# Patient Record
Sex: Male | Born: 1950 | Marital: Married | Smoking: Current some day smoker
Health system: Southern US, Community
[De-identification: ages and names within clinical notes are randomized; demographics above are authoritative.]

## PROBLEM LIST (undated history)

## (undated) DIAGNOSIS — E785 Hyperlipidemia, unspecified: Secondary | ICD-10-CM

## (undated) DIAGNOSIS — D490 Neoplasm of unspecified behavior of digestive system: Secondary | ICD-10-CM

## (undated) DIAGNOSIS — I10 Essential (primary) hypertension: Secondary | ICD-10-CM

## (undated) DIAGNOSIS — E119 Type 2 diabetes mellitus without complications: Secondary | ICD-10-CM

## (undated) DIAGNOSIS — I6529 Occlusion and stenosis of unspecified carotid artery: Secondary | ICD-10-CM

## (undated) DIAGNOSIS — N189 Chronic kidney disease, unspecified: Secondary | ICD-10-CM

## (undated) DIAGNOSIS — N529 Male erectile dysfunction, unspecified: Secondary | ICD-10-CM

## (undated) DIAGNOSIS — Z720 Tobacco use: Secondary | ICD-10-CM

## (undated) HISTORY — DX: Tobacco use: Z72.0

## (undated) HISTORY — DX: Hyperlipidemia, unspecified: E78.5

## (undated) HISTORY — PX: TONSILLECTOMY: SUR1361

## (undated) HISTORY — DX: Type 2 diabetes mellitus without complications: E11.9

## (undated) HISTORY — DX: Chronic kidney disease, unspecified: N18.9

## (undated) HISTORY — DX: Male erectile dysfunction, unspecified: N52.9

---
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
## 1999-09-24 ENCOUNTER — Emergency Department (HOSPITAL_COMMUNITY): Admission: EM | Admit: 1999-09-24 | Discharge: 1999-09-24 | Payer: Self-pay | Admitting: Emergency Medicine

## 2001-06-21 ENCOUNTER — Encounter: Admission: RE | Admit: 2001-06-21 | Discharge: 2001-09-19 | Payer: Self-pay | Admitting: Family Medicine

## 2004-01-13 HISTORY — PX: APPENDECTOMY: SHX54

## 2004-01-18 ENCOUNTER — Emergency Department (HOSPITAL_COMMUNITY): Admission: EM | Admit: 2004-01-18 | Discharge: 2004-01-19 | Payer: Self-pay | Admitting: Emergency Medicine

## 2004-03-01 ENCOUNTER — Emergency Department (HOSPITAL_COMMUNITY): Admission: EM | Admit: 2004-03-01 | Discharge: 2004-03-01 | Payer: Self-pay | Admitting: *Deleted

## 2004-03-02 ENCOUNTER — Observation Stay (HOSPITAL_COMMUNITY): Admission: EM | Admit: 2004-03-02 | Discharge: 2004-03-02 | Payer: Self-pay | Admitting: Emergency Medicine

## 2011-10-08 ENCOUNTER — Other Ambulatory Visit: Payer: Self-pay | Admitting: Gastroenterology

## 2013-08-12 ENCOUNTER — Encounter: Payer: Self-pay | Admitting: *Deleted

## 2015-03-21 ENCOUNTER — Other Ambulatory Visit: Payer: Self-pay | Admitting: Family Medicine

## 2015-03-21 DIAGNOSIS — E049 Nontoxic goiter, unspecified: Secondary | ICD-10-CM

## 2015-03-26 ENCOUNTER — Ambulatory Visit
Admission: RE | Admit: 2015-03-26 | Discharge: 2015-03-26 | Disposition: A | Discharge status: Home / Self Care | Payer: BLUE CROSS/BLUE SHIELD | Source: Ambulatory Visit | Attending: Family Medicine | Admitting: Family Medicine

## 2015-03-26 DIAGNOSIS — E049 Nontoxic goiter, unspecified: Secondary | ICD-10-CM

## 2017-02-15 DIAGNOSIS — I10 Essential (primary) hypertension: Secondary | ICD-10-CM | POA: Diagnosis not present

## 2017-02-15 DIAGNOSIS — Z0000 Encounter for general adult medical examination without abnormal findings: Secondary | ICD-10-CM | POA: Diagnosis not present

## 2017-02-15 DIAGNOSIS — E782 Mixed hyperlipidemia: Secondary | ICD-10-CM | POA: Diagnosis not present

## 2017-02-15 DIAGNOSIS — E1122 Type 2 diabetes mellitus with diabetic chronic kidney disease: Secondary | ICD-10-CM | POA: Diagnosis not present

## 2017-02-15 DIAGNOSIS — Z1159 Encounter for screening for other viral diseases: Secondary | ICD-10-CM | POA: Diagnosis not present

## 2017-02-15 DIAGNOSIS — Z23 Encounter for immunization: Secondary | ICD-10-CM | POA: Diagnosis not present

## 2017-02-15 DIAGNOSIS — G629 Polyneuropathy, unspecified: Secondary | ICD-10-CM | POA: Diagnosis not present

## 2017-02-15 DIAGNOSIS — E559 Vitamin D deficiency, unspecified: Secondary | ICD-10-CM | POA: Diagnosis not present

## 2017-02-15 DIAGNOSIS — L570 Actinic keratosis: Secondary | ICD-10-CM | POA: Diagnosis not present

## 2017-02-15 DIAGNOSIS — N183 Chronic kidney disease, stage 3 (moderate): Secondary | ICD-10-CM | POA: Diagnosis not present

## 2017-05-10 IMAGING — US US SOFT TISSUE HEAD/NECK
1 series · 13 of 25 positions shown · non-contrast
Comparison: None.

CLINICAL DATA: Enlarged thyroid gland on physical examination.

EXAM:
THYROID ULTRASOUND
TECHNIQUE: Ultrasound examination of the thyroid gland and adjacent soft
tissues was performed.

[Series 1: us soft tissue head/neck · 0.06mm/px · 13 of 39 slices shown]
[im 1/39]
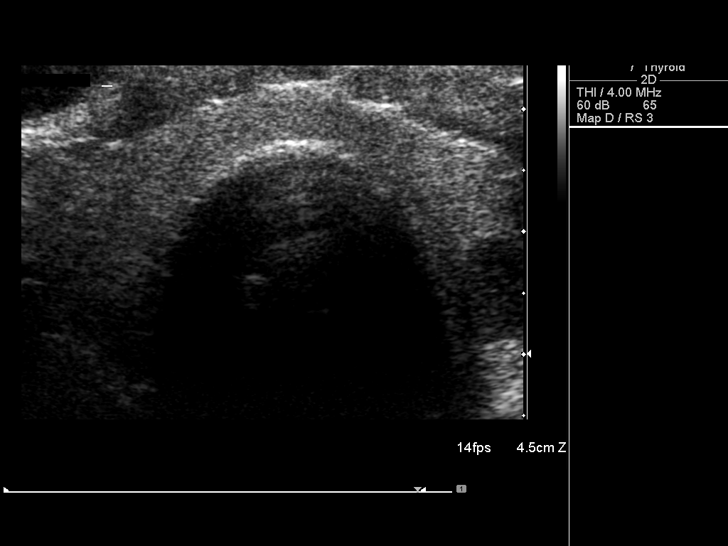
[im 4/39]
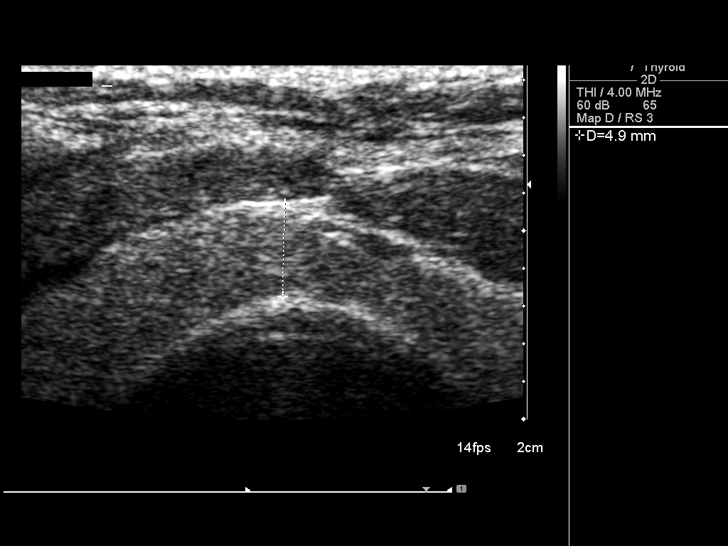
[im 7/39]
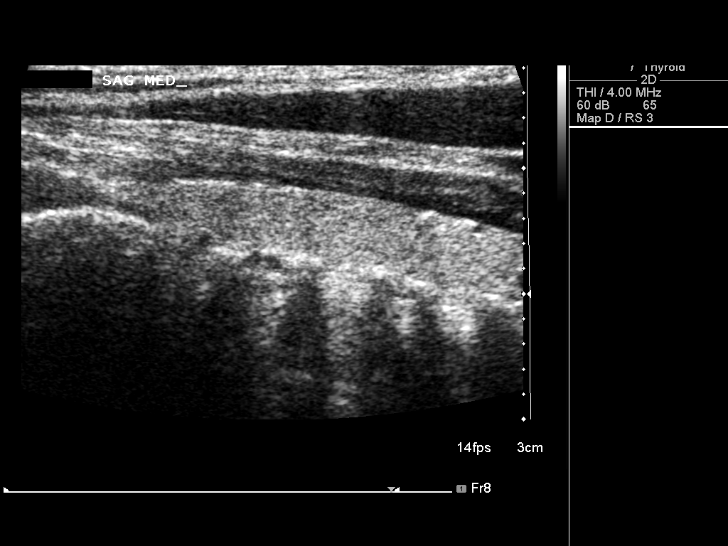
[im 10/39]
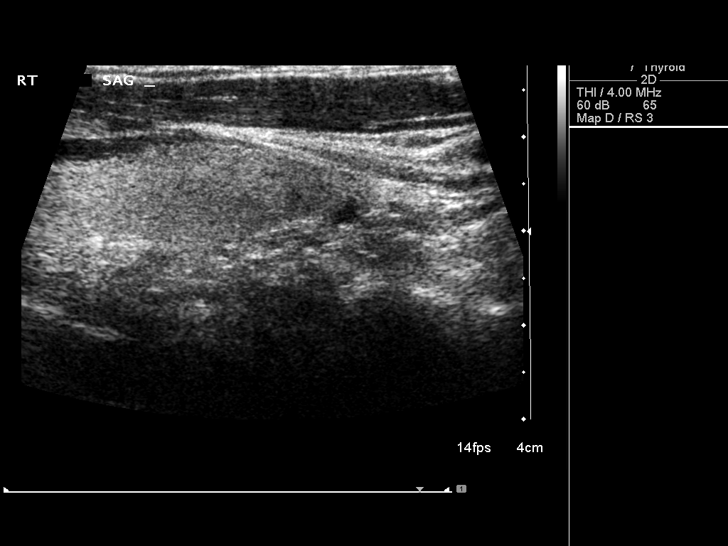
[im 13/39]
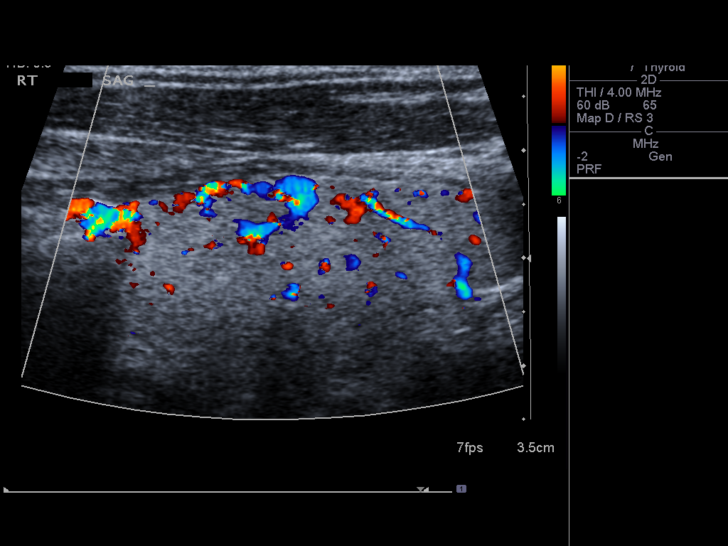
[im 16/39]
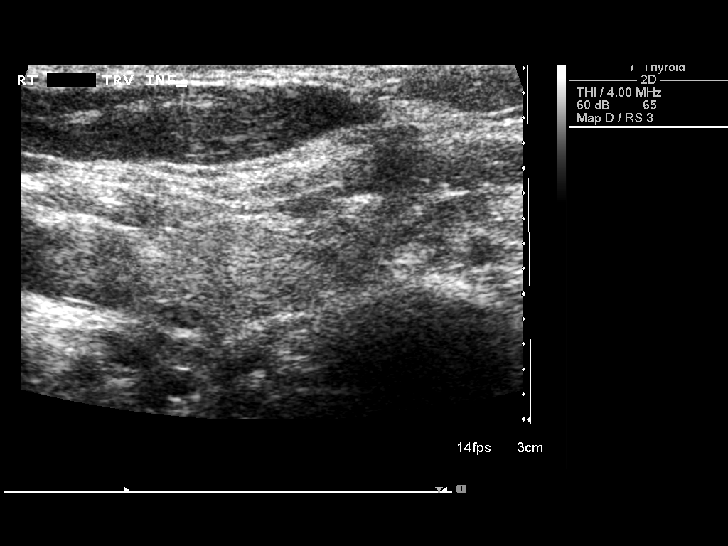
[im 20/39]
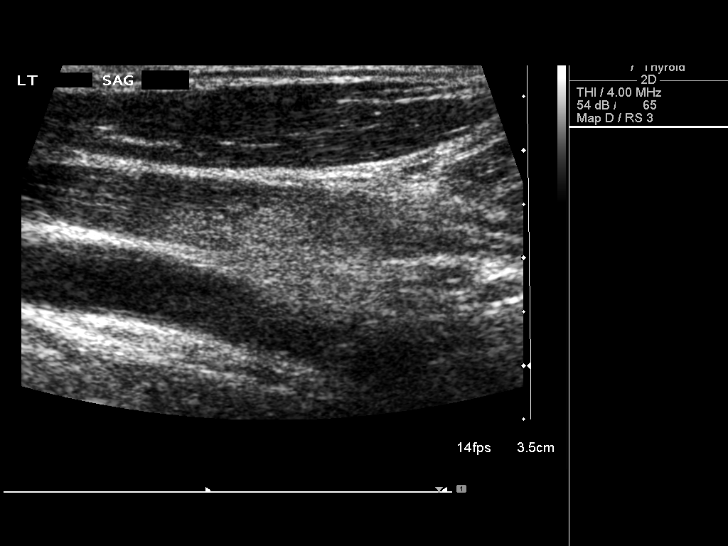
[im 23/39]
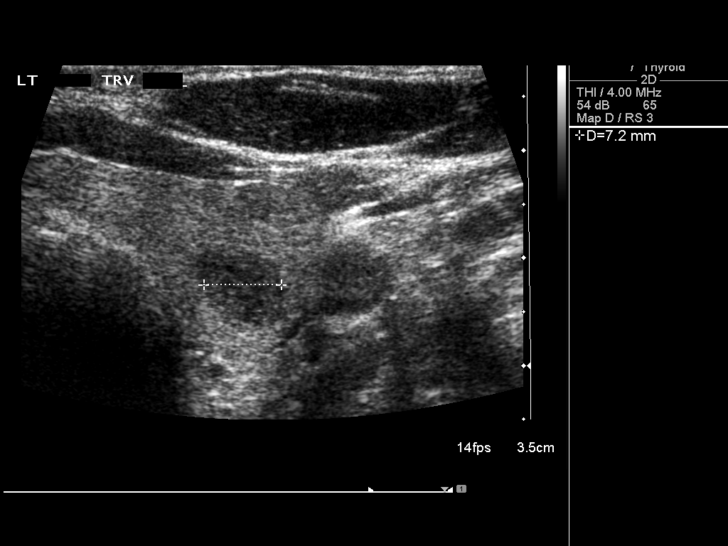
[im 26/39]
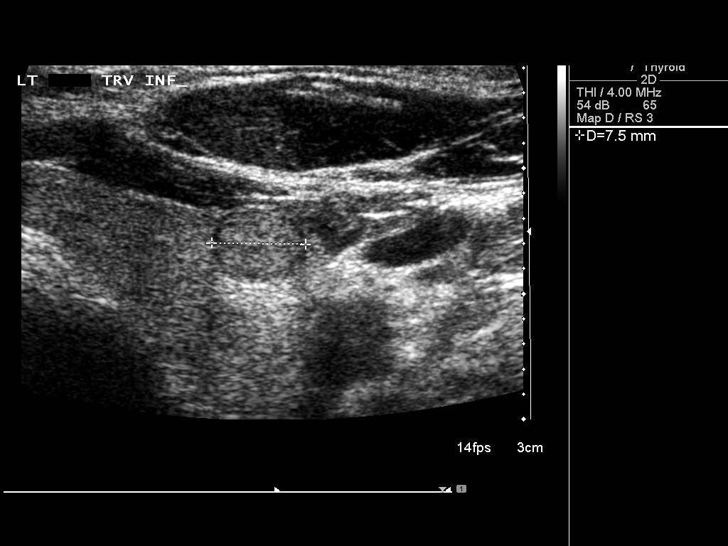
[im 29/39]
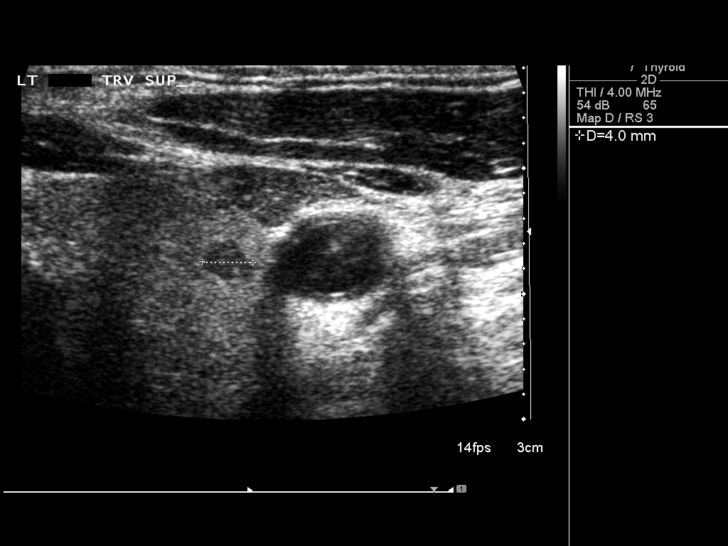
[im 32/39]
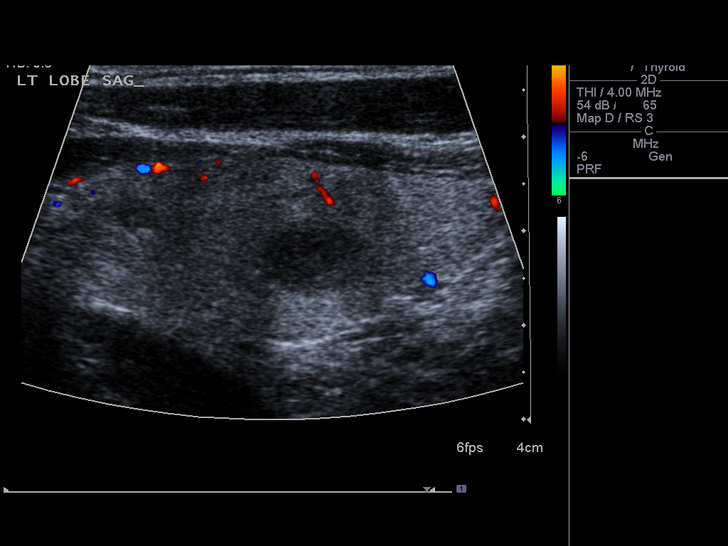
[im 35/39]
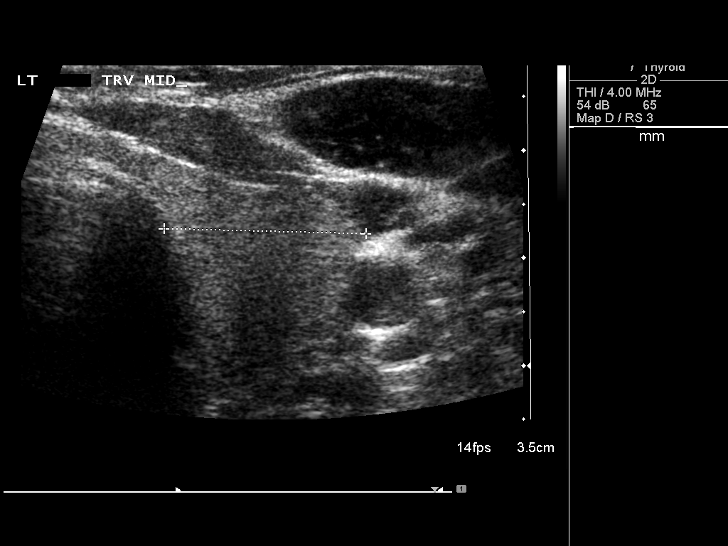
[im 39/39]
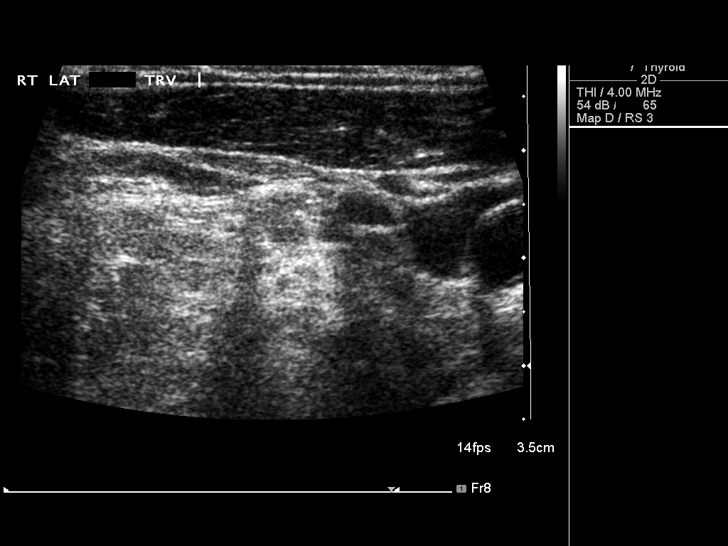

[13 of 25 positions shown; findings below may reference images not displayed]

FINDINGS: Right thyroid lobe

Measurements: 4.4 x 2.4 x 2.1 cm.  No nodules visualized.

Left thyroid lobe

Measurements: 4.5 x 2.1 x 1.9 cm.

There are 3 distinct hypoechoic solid nodules in the left lobe. The
largest in the inferior left lobe measures approximately 1.1 x 0.8 x
0.7 cm. The second largest is immediately adjacent and measures
approximately 0.8 x 0.6 x 0.8 cm. Third smaller superior nodule
measures approximately 0.6 x 0.4 x 0.4 cm. All of these nodules are
noncalcified.

Isthmus

Thickness: 0.5 cm.  No nodules visualized.

Lymphadenopathy

None visualized.
IMPRESSION: Overall top-normal size of thyroid gland. Three small distinct
nodules are seen in the left lobe. These do not meet criteria for
biopsy and can be followed.

Findings do not meet current SRU consensus criteria for biopsy.
Follow-up by clinical exam is recommended. If patient has known risk
factors for thyroid carcinoma, consider follow-up ultrasound in 12
months. If patient is clinically hyperthyroid, consider nuclear
medicine thyroid uptake and scan.Reference: Management of Thyroid
Nodules Detected at US: Society of Radiologists in Ultrasound

## 2017-05-13 DIAGNOSIS — L821 Other seborrheic keratosis: Secondary | ICD-10-CM | POA: Diagnosis not present

## 2017-05-13 DIAGNOSIS — L570 Actinic keratosis: Secondary | ICD-10-CM | POA: Diagnosis not present

## 2017-05-13 DIAGNOSIS — L814 Other melanin hyperpigmentation: Secondary | ICD-10-CM | POA: Diagnosis not present

## 2017-05-13 DIAGNOSIS — D225 Melanocytic nevi of trunk: Secondary | ICD-10-CM | POA: Diagnosis not present

## 2017-07-21 DIAGNOSIS — N183 Chronic kidney disease, stage 3 (moderate): Secondary | ICD-10-CM | POA: Diagnosis not present

## 2017-07-21 DIAGNOSIS — E1122 Type 2 diabetes mellitus with diabetic chronic kidney disease: Secondary | ICD-10-CM | POA: Diagnosis not present

## 2017-07-21 DIAGNOSIS — Z7984 Long term (current) use of oral hypoglycemic drugs: Secondary | ICD-10-CM | POA: Diagnosis not present

## 2017-07-21 DIAGNOSIS — E782 Mixed hyperlipidemia: Secondary | ICD-10-CM | POA: Diagnosis not present

## 2017-07-21 DIAGNOSIS — I10 Essential (primary) hypertension: Secondary | ICD-10-CM | POA: Diagnosis not present

## 2017-07-21 DIAGNOSIS — G629 Polyneuropathy, unspecified: Secondary | ICD-10-CM | POA: Diagnosis not present

## 2017-12-07 DIAGNOSIS — L578 Other skin changes due to chronic exposure to nonionizing radiation: Secondary | ICD-10-CM | POA: Diagnosis not present

## 2017-12-07 DIAGNOSIS — L570 Actinic keratosis: Secondary | ICD-10-CM | POA: Diagnosis not present

## 2017-12-07 DIAGNOSIS — C4442 Squamous cell carcinoma of skin of scalp and neck: Secondary | ICD-10-CM | POA: Diagnosis not present

## 2017-12-07 DIAGNOSIS — D225 Melanocytic nevi of trunk: Secondary | ICD-10-CM | POA: Diagnosis not present

## 2017-12-07 DIAGNOSIS — D485 Neoplasm of uncertain behavior of skin: Secondary | ICD-10-CM | POA: Diagnosis not present

## 2017-12-27 DIAGNOSIS — R69 Illness, unspecified: Secondary | ICD-10-CM | POA: Diagnosis not present

## 2018-02-01 DIAGNOSIS — C4442 Squamous cell carcinoma of skin of scalp and neck: Secondary | ICD-10-CM | POA: Diagnosis not present

## 2018-02-28 ENCOUNTER — Other Ambulatory Visit: Payer: Self-pay

## 2018-02-28 DIAGNOSIS — I10 Essential (primary) hypertension: Secondary | ICD-10-CM | POA: Diagnosis not present

## 2018-02-28 DIAGNOSIS — E782 Mixed hyperlipidemia: Secondary | ICD-10-CM | POA: Diagnosis not present

## 2018-02-28 DIAGNOSIS — Z1211 Encounter for screening for malignant neoplasm of colon: Secondary | ICD-10-CM | POA: Diagnosis not present

## 2018-02-28 DIAGNOSIS — E1122 Type 2 diabetes mellitus with diabetic chronic kidney disease: Secondary | ICD-10-CM | POA: Diagnosis not present

## 2018-02-28 NOTE — Patient Outreach (Signed)
  [facility]) Care Management Chronic Special Needs Program   02/28/2018  Name: Brookes Craine, DOB: 1950/09/15  MRN: [id number]  The client was discussed in today's interdisciplinary care team meeting. The client's individualized care plan was developed based on completed Health Risk Assessment   The following issues were discussed:  Client's needs, Key risk triggers/risk stratification and Care Plan  Participants present:    Mahlon Gammon, MSN, RN, CCM, CNS    Thea Silversmith, MSN, RN, CCM   Melissa Sandlin RN,BSN,CCM, CDE       Recommendations:  Send Quit smoking information, diabetes and exercise information  Plan:  Plan to send copy of individualized care plan to client Plan to send individualized care plan to provider. Chronic Care Management Coordinator will outreach in 2-4 months  Follow-up:  2-4 months  Peter Garter RN, Jackquline Denmark, CDE Chronic Care Management Coordinator [place] [facility] [phone]

## 2018-03-16 DIAGNOSIS — Z7984 Long term (current) use of oral hypoglycemic drugs: Secondary | ICD-10-CM | POA: Diagnosis not present

## 2018-03-16 DIAGNOSIS — E119 Type 2 diabetes mellitus without complications: Secondary | ICD-10-CM | POA: Diagnosis not present

## 2018-03-16 DIAGNOSIS — H2513 Age-related nuclear cataract, bilateral: Secondary | ICD-10-CM | POA: Diagnosis not present

## 2018-05-19 DIAGNOSIS — L570 Actinic keratosis: Secondary | ICD-10-CM | POA: Diagnosis not present

## 2018-05-19 DIAGNOSIS — R238 Other skin changes: Secondary | ICD-10-CM | POA: Diagnosis not present

## 2018-05-19 DIAGNOSIS — D485 Neoplasm of uncertain behavior of skin: Secondary | ICD-10-CM | POA: Diagnosis not present

## 2018-05-19 DIAGNOSIS — C44321 Squamous cell carcinoma of skin of nose: Secondary | ICD-10-CM | POA: Diagnosis not present

## 2018-06-21 ENCOUNTER — Other Ambulatory Visit: Payer: Self-pay

## 2018-06-21 NOTE — Patient Outreach (Signed)
[facility]) Care Management Chronic Special Needs Program  06/21/2018  Name: Joseph Sweeney DOB: [birth year]  MRN: [id number]  Mr. Joseph Sweeney is enrolled in a chronic special needs plan for Diabetes. Chronic Care Management Coordinator telephoned client to review health risk assessment and to develop individualized care plan.  Introduced the chronic care management program, importance of client participation, and taking their care plan to all provider appointments and inpatient facilities.  Reviewed the transition of care process and possible referral to community care management.  Subjective: Client states that he has been working on getting his Hemoglobin A1C lower after his reading was up with his last check.  States that his provider increased his Metformin.  States he checks his blood sugar every other day and they range 82-115 in the morning and can be up to 200 after meals.  States he was having some low readings but has not had one this month.  States he does yard work for exercise and he was swimming at [employer]  When it was open.  States he is still smoking but he has cut back some.  Goals Addressed            This Visit's Progress   . Client understands the importance of follow-up with providers by attending scheduled visits   On track   . Client will use Assistive Devices as needed and verbalize understanding of device use   On track    [employer] approved meters-One Touch, Freestyle or Precision    . Decrease inpatient admissions/ readmissions with in the next year   On track   . Decrease inpatient diabetes admissions/readmissions with in the year   On track   . Decrease the use of hospital emergency department related to diabetes within the next year    On track   . Diabetes Patient stated goal to get more exercise (pt-stated)   On track   . HEMOGLOBIN A1C < 7.0       Diabetes self management actions:  Glucose monitoring per provider recommendations   Eat Healthy  Check feet daily  Visit provider every 3-6 months as directed  Hbg A1C level every 3-6 months.  Eye Exam yearly    . Maintain timely refills of diabetic medication as prescribed within the year .   On track   . COMPLETED: Obtain annual  Lipid Profile, LDL-C       Completed 02/28/2018     . COMPLETED: Obtain Annual Eye (retinal)  Exam    On track    Completed 03/16/2018    . COMPLETED: Obtain Annual Foot Exam       Completed 02/28/2018    . Obtain annual screen for micro albuminuria (urine) , nephropathy (kidney problems)   On track   . Obtain Hemoglobin A1C at least 2 times per year   On track   . Quit Smoking (pt-stated)   Not on track   . Visit Primary Care Provider or Endocrinologist at least 2 times per year    On track    Client is not meeting diabetes self management goal of hemoglobin A1C of <7% with last reading of 7.7% Reinforced importance of smoking cessation Reviewed s/s of hypoglycemia and the rule of 15 to treat hypoglycemia.   Encouraged to continue to be active and to return to swimming when possible  Plan:  Send successful outreach letter with a copy of their individualized care plan, Send individual care plan to provider and Send  educational material-hypoglycemia, smoking cessation  Chronic care management coordination will outreach in:  5-6 Months     Peter Garter RN, [facility], [place] Management Coordinator [place] Management [phone]

## 2018-07-07 DIAGNOSIS — Z86010 Personal history of colonic polyps: Secondary | ICD-10-CM | POA: Diagnosis not present

## 2018-07-07 DIAGNOSIS — D120 Benign neoplasm of cecum: Secondary | ICD-10-CM | POA: Diagnosis not present

## 2018-07-13 DIAGNOSIS — D120 Benign neoplasm of cecum: Secondary | ICD-10-CM | POA: Diagnosis not present

## 2018-07-14 DIAGNOSIS — C44321 Squamous cell carcinoma of skin of nose: Secondary | ICD-10-CM | POA: Diagnosis not present

## 2018-07-14 DIAGNOSIS — L570 Actinic keratosis: Secondary | ICD-10-CM | POA: Diagnosis not present

## 2018-07-26 DIAGNOSIS — Z0000 Encounter for general adult medical examination without abnormal findings: Secondary | ICD-10-CM | POA: Diagnosis not present

## 2018-07-26 DIAGNOSIS — Z125 Encounter for screening for malignant neoplasm of prostate: Secondary | ICD-10-CM | POA: Diagnosis not present

## 2018-07-26 DIAGNOSIS — E782 Mixed hyperlipidemia: Secondary | ICD-10-CM | POA: Diagnosis not present

## 2018-07-26 DIAGNOSIS — E559 Vitamin D deficiency, unspecified: Secondary | ICD-10-CM | POA: Diagnosis not present

## 2018-07-26 DIAGNOSIS — I10 Essential (primary) hypertension: Secondary | ICD-10-CM | POA: Diagnosis not present

## 2018-07-26 DIAGNOSIS — E1122 Type 2 diabetes mellitus with diabetic chronic kidney disease: Secondary | ICD-10-CM | POA: Diagnosis not present

## 2018-08-04 DIAGNOSIS — I10 Essential (primary) hypertension: Secondary | ICD-10-CM | POA: Diagnosis not present

## 2018-08-04 DIAGNOSIS — Z125 Encounter for screening for malignant neoplasm of prostate: Secondary | ICD-10-CM | POA: Diagnosis not present

## 2018-08-04 DIAGNOSIS — E559 Vitamin D deficiency, unspecified: Secondary | ICD-10-CM | POA: Diagnosis not present

## 2018-08-04 DIAGNOSIS — E1122 Type 2 diabetes mellitus with diabetic chronic kidney disease: Secondary | ICD-10-CM | POA: Diagnosis not present

## 2018-08-04 DIAGNOSIS — E782 Mixed hyperlipidemia: Secondary | ICD-10-CM | POA: Diagnosis not present

## 2018-08-04 DIAGNOSIS — Z0000 Encounter for general adult medical examination without abnormal findings: Secondary | ICD-10-CM | POA: Diagnosis not present

## 2018-09-01 DIAGNOSIS — C44321 Squamous cell carcinoma of skin of nose: Secondary | ICD-10-CM | POA: Diagnosis not present

## 2018-09-26 DIAGNOSIS — S0501XA Injury of conjunctiva and corneal abrasion without foreign body, right eye, initial encounter: Secondary | ICD-10-CM | POA: Diagnosis not present

## 2018-09-26 DIAGNOSIS — H5711 Ocular pain, right eye: Secondary | ICD-10-CM | POA: Diagnosis not present

## 2018-09-28 DIAGNOSIS — S0501XD Injury of conjunctiva and corneal abrasion without foreign body, right eye, subsequent encounter: Secondary | ICD-10-CM | POA: Diagnosis not present

## 2018-10-24 DIAGNOSIS — I10 Essential (primary) hypertension: Secondary | ICD-10-CM | POA: Diagnosis not present

## 2018-10-24 DIAGNOSIS — E1122 Type 2 diabetes mellitus with diabetic chronic kidney disease: Secondary | ICD-10-CM | POA: Diagnosis not present

## 2018-10-24 DIAGNOSIS — N1831 Chronic kidney disease, stage 3a: Secondary | ICD-10-CM | POA: Diagnosis not present

## 2018-10-24 DIAGNOSIS — Z23 Encounter for immunization: Secondary | ICD-10-CM | POA: Diagnosis not present

## 2018-10-24 DIAGNOSIS — N521 Erectile dysfunction due to diseases classified elsewhere: Secondary | ICD-10-CM | POA: Diagnosis not present

## 2018-11-17 DIAGNOSIS — L570 Actinic keratosis: Secondary | ICD-10-CM | POA: Diagnosis not present

## 2018-11-17 DIAGNOSIS — D225 Melanocytic nevi of trunk: Secondary | ICD-10-CM | POA: Diagnosis not present

## 2018-11-17 DIAGNOSIS — Z85828 Personal history of other malignant neoplasm of skin: Secondary | ICD-10-CM | POA: Diagnosis not present

## 2018-12-13 ENCOUNTER — Other Ambulatory Visit: Payer: Self-pay

## 2018-12-13 DIAGNOSIS — I10 Essential (primary) hypertension: Secondary | ICD-10-CM

## 2018-12-13 DIAGNOSIS — E785 Hyperlipidemia, unspecified: Secondary | ICD-10-CM | POA: Insufficient documentation

## 2018-12-13 DIAGNOSIS — N183 Chronic kidney disease, stage 3 unspecified: Secondary | ICD-10-CM

## 2018-12-13 DIAGNOSIS — E782 Mixed hyperlipidemia: Secondary | ICD-10-CM

## 2018-12-13 DIAGNOSIS — E1122 Type 2 diabetes mellitus with diabetic chronic kidney disease: Secondary | ICD-10-CM | POA: Insufficient documentation

## 2018-12-13 NOTE — Patient Outreach (Signed)
[facility]) Care Management Chronic Special Needs Program  12/13/2018  Name: Joseph Sweeney DOB: 05/14/1950  MRN: [id number]  Mr. Joseph Sweeney is enrolled in a chronic special needs plan for Diabetes. Reviewed and updated care plan.  Subjective: States he ash been doing good.  States he is active working in his yard and he does not feel safe to go to [employer] yet.  States he sometimes has low blood sugar readings when he is out working in the yard.  States he sometimes does not eat lunch or eats a snack.  States he is still smoking but he has cut back to 1/2 pack a day.  States he tries to watch his diet but he sometimes has larger portions at dinner especially with pasta.  Denies any problems with his medications  Goals Addressed            This Visit's Progress   . Client understands the importance of follow-up with providers by attending scheduled visits   On track    Keep appts as scheduled    . COMPLETED: Client will use Assistive Devices as needed and verbalize understanding of device use   On track    No difficulty with glucometer    . COMPLETED: Decrease inpatient admissions/ readmissions with in the next year   On track    No admissions    . COMPLETED: Decrease inpatient diabetes admissions/readmissions with in the year   On track    No admissions    . COMPLETED: Decrease the use of hospital emergency department related to diabetes within the next year        No ED visits    . Diabetes Patient stated goal to get more exercise (pt-stated)   On track    Continue to be active around home and start going to [facility] when safe    . HEMOGLOBIN A1C < 7.0        Diabetes self management actions:  Glucose monitoring per provider recommendations  Eat Healthy  Check feet daily  Visit provider every 3-6 months as directed  Hbg A1C level every 3-6 months.  Eye Exam yearly    . Maintain timely refills of diabetic medication as prescribed within the year .   On  track    No difficulties with medications    . COMPLETED: Obtain annual screen for micro albuminuria (urine) , nephropathy (kidney problems)       Completed 10/24/18    . COMPLETED: Obtain Hemoglobin A1C at least 2 times per year       Completed 02/28/18, 08/04/18    . Quit Smoking (pt-stated)   Not on track    [employer] [phone]    . COMPLETED: Visit Primary Care Provider or Endocrinologist at least 2 times per year        Completed 02/28/18, 08/04/18, 10/24/18      Client is meeting diabetes self management goal of hemoglobin A1C of <7% with last reading of 6.9% Reinforced to follow a low CHO low sodium diet and reviewed portion sizes of pasta Encouraged to continue to be active working in his yard and to go back to [employer] when it is safe to do so Discussed smoking cessation and the quit smart program Reviewed s/s of hypoglycemia and actions to take.  Encouraged to eat a snack when working in the yard Reviewed number for 24-hour [place] 19 precautions  Plan:  Send successful outreach letter with a copy of  their individualized care plan and Send individual care plan to provider  Chronic care management coordinator will outreach in:  6-8 Months per tier level    [place], [facility], [place] Management [phone]

## 2019-02-20 DIAGNOSIS — I10 Essential (primary) hypertension: Secondary | ICD-10-CM | POA: Diagnosis not present

## 2019-02-20 DIAGNOSIS — N1831 Chronic kidney disease, stage 3a: Secondary | ICD-10-CM | POA: Diagnosis not present

## 2019-02-20 DIAGNOSIS — F17200 Nicotine dependence, unspecified, uncomplicated: Secondary | ICD-10-CM | POA: Diagnosis not present

## 2019-02-20 DIAGNOSIS — E782 Mixed hyperlipidemia: Secondary | ICD-10-CM | POA: Diagnosis not present

## 2019-02-20 DIAGNOSIS — E1122 Type 2 diabetes mellitus with diabetic chronic kidney disease: Secondary | ICD-10-CM | POA: Diagnosis not present

## 2019-03-05 ENCOUNTER — Ambulatory Visit: Payer: HMO

## 2019-03-09 ENCOUNTER — Ambulatory Visit: Payer: HMO | Attending: Internal Medicine

## 2019-03-09 DIAGNOSIS — Z23 Encounter for immunization: Secondary | ICD-10-CM | POA: Insufficient documentation

## 2019-03-09 NOTE — Progress Notes (Signed)
   Covid-19 Vaccination Clinic  Name:  Joseph Sweeney    MRN: [id number] DOB: Dec 16, 1950  03/09/2019  Joseph Sweeney was observed post Covid-19 immunization for 15 minutes without incidence. He was provided with Vaccine Information Sheet and instruction to access the V-Safe system.   Joseph Sweeney was instructed to call 911 with any severe reactions post vaccine: Marland Kitchen Difficulty breathing  . Swelling of your face and throat  . A fast heartbeat  . A bad rash all over your body  . Dizziness and weakness    Immunizations Administered    Name Date Dose VIS Date Route   Pfizer COVID-19 Vaccine 03/09/2019  9:37 AM 0.3 mL 12/23/2018 Intramuscular   Manufacturer: [place]   Lot: [id number]   [place]: [id number]

## 2019-03-29 ENCOUNTER — Ambulatory Visit: Payer: HMO

## 2019-04-18 ENCOUNTER — Ambulatory Visit: Payer: HMO | Attending: Internal Medicine

## 2019-04-18 DIAGNOSIS — Z23 Encounter for immunization: Secondary | ICD-10-CM

## 2019-04-18 NOTE — Progress Notes (Signed)
   Covid-19 Vaccination Clinic  Name:  Joseph Sweeney    MRN: [id number] DOB: 1950/08/17  04/18/2019  Mr. Hinger was observed post Covid-19 immunization for 15 minutes without incident. He was provided with Vaccine Information Sheet and instruction to access the V-Safe system.   Mr. Gerhold was instructed to call 911 with any severe reactions post vaccine: Marland Kitchen Difficulty breathing  . Swelling of face and throat  . A fast heartbeat  . A bad rash all over body  . Dizziness and weakness   Immunizations Administered    Name Date Dose VIS Date Route   Pfizer COVID-19 Vaccine 04/18/2019 11:58 AM 0.3 mL 12/23/2018 Intramuscular   Manufacturer: [place]   Lot: [id number]   [place]: [id number]

## 2019-05-22 DIAGNOSIS — L821 Other seborrheic keratosis: Secondary | ICD-10-CM | POA: Diagnosis not present

## 2019-05-22 DIAGNOSIS — L570 Actinic keratosis: Secondary | ICD-10-CM | POA: Diagnosis not present

## 2019-05-22 DIAGNOSIS — L578 Other skin changes due to chronic exposure to nonionizing radiation: Secondary | ICD-10-CM | POA: Diagnosis not present

## 2019-05-22 DIAGNOSIS — Z85828 Personal history of other malignant neoplasm of skin: Secondary | ICD-10-CM | POA: Diagnosis not present

## 2019-05-22 DIAGNOSIS — L905 Scar conditions and fibrosis of skin: Secondary | ICD-10-CM | POA: Diagnosis not present

## 2019-05-22 DIAGNOSIS — D225 Melanocytic nevi of trunk: Secondary | ICD-10-CM | POA: Diagnosis not present

## 2019-05-22 DIAGNOSIS — D1801 Hemangioma of skin and subcutaneous tissue: Secondary | ICD-10-CM | POA: Diagnosis not present

## 2019-05-22 DIAGNOSIS — L814 Other melanin hyperpigmentation: Secondary | ICD-10-CM | POA: Diagnosis not present

## 2019-06-13 ENCOUNTER — Other Ambulatory Visit: Payer: Self-pay

## 2019-06-13 NOTE — Patient Outreach (Signed)
°  [facility]) Care Management Chronic Special Needs Program  06/13/2019  Name: Joseph Sweeney DOB: 03-19-1950  MRN: [id number]  Mr. Joseph Sweeney is enrolled in a chronic special needs plan for Diabetes. Client called with no answer No answer and HIPAA compliant message left. 1st attempt Plan for 2nd outreach call in 1-2 weeks Chronic care management coordinator will attempt outreach in 1-2 weeks.   Peter Garter RN, Jackquline [place], CDE Chronic Care Management Coordinator [place] [facility] [phone]

## 2019-06-26 ENCOUNTER — Other Ambulatory Visit: Payer: Self-pay

## 2019-06-26 NOTE — Patient Outreach (Signed)
  [employer] [facility]) Care Management Chronic Special Needs Program  06/26/2019  Name: Amaan Meyer DOB: 14-Jun-1950  MRN: [id number]  Mr. Rockney Grenz is enrolled in a chronic special needs plan for Diabetes. Client called with no answer No answer and HIPAA compliant message left. 2nd attempt Plan for 3rd outreach call in one week Chronic care management coordinator will attempt outreach in one week.   Peter Garter RN, Jackquline Denmark, CDE Chronic Care Management Coordinator [place] [facility] [phone]

## 2019-06-29 DIAGNOSIS — E782 Mixed hyperlipidemia: Secondary | ICD-10-CM | POA: Diagnosis not present

## 2019-06-29 DIAGNOSIS — N183 Chronic kidney disease, stage 3 unspecified: Secondary | ICD-10-CM | POA: Diagnosis not present

## 2019-06-29 DIAGNOSIS — I10 Essential (primary) hypertension: Secondary | ICD-10-CM | POA: Diagnosis not present

## 2019-06-29 DIAGNOSIS — E1122 Type 2 diabetes mellitus with diabetic chronic kidney disease: Secondary | ICD-10-CM | POA: Diagnosis not present

## 2019-07-03 ENCOUNTER — Other Ambulatory Visit: Payer: Self-pay

## 2019-07-03 NOTE — Patient Outreach (Signed)
[facility]) Care Management Chronic Special Needs Program  07/03/2019  Name: Joseph Sweeney DOB: [birth year]  MRN: [id number]  Mr. Joseph Sweeney is enrolled in a chronic special needs plan for  Diabetes.  Client called with no answer and HIPAA compliant message left.  3rd attempt A completed health risk assessment has  been received from the client.  Client has not responded to 3 outreach attempts by [facility] to update individualized care plan  The clients individualized care plan was developed based on available data and  2021 Health Risk Assessment Goals Addressed              This Visit's Progress     Client understands the importance of follow-up with providers by attending scheduled visits   On track     Keep appts as scheduled      Diabetes Patient stated goal to get more exercise (pt-stated)   On track     Continue to be active around home and start going to [facility] when safe      HEMOGLOBIN A1C < 7         Last Hemoglobin A1C 6.8% on 02/20/19 Plan to check blood sugars as directed with goals fasting or 1 1/2 hours after eating with goal of 80-130 fasting and 180 or less after meals Plan to follow a low carbohydrate, low salt diet, watch portion sizes and avoid sugar sweetened drinks Plan to exercise 150 minutes a week including two sessions of resistance exercise weekly      COMPLETED: Maintain timely refills of diabetic medication as prescribed within the year .   On track     Maintaining timely refills of medications per dispense report It is important to get your medications refilled on time Goal completed 07/03/19      COMPLETED: Obtain annual  Lipid Profile, LDL-C   On track     Completed 02/20/19 LDL 75 The goal for LDL is less than 70 mg/dL as you are at high risk for complications Try to avoid saturated fats, trans-fats and eat more fiber Plan to take statin as ordered  Goal completed 07/03/19       Obtain Annual Eye (retinal)  Exam    On track      Completed 03/16/2018 Plan to have a dilated eye exam every year      Obtain Annual Foot Exam   On track     Completed 02/28/2018 Check your skin and feet every day for cuts, bruises, redness, blisters, or sores. Report to provider any problems with your feet Schedule a foot exam with your health care provider once every year      Obtain annual screen for micro albuminuria (urine) , nephropathy (kidney problems)   On track     Completed 10/24/18 It is important for your doctor to check your urine for protein at least every year      Obtain Hemoglobin A1C at least 2 times per year   On track     Completed 02/20/19 It is important to have your Hemoglobin A1C checked every 6 months if you are at goal and every 3 months if you are not at goal      Quit Smoking (pt-stated)   Not on track     Quit Smart [phone] Sent EMMI-Quitting smoking for older adults      Visit Primary Care Provider or Endocrinologist at least 2 times per year    On track     Primary care provider  02/20/19 Annual Wellness visit 07/26/2018 Please schedule your annual wellness visit        Plan:   Send unsuccessful outreach letter with a copy of individualized care plan to client  Send individualized care plan to provider  Educational Materials-EMMI-Quitting smoking for older adults  Chronic care management coordinator will attempt outreach in 12 months.  Peter Garter RN, Jackquline Denmark, CDE Chronic Care Management Coordinator [place] [facility] [phone]

## 2019-07-06 DIAGNOSIS — H2513 Age-related nuclear cataract, bilateral: Secondary | ICD-10-CM | POA: Diagnosis not present

## 2019-07-06 DIAGNOSIS — Z7984 Long term (current) use of oral hypoglycemic drugs: Secondary | ICD-10-CM | POA: Diagnosis not present

## 2019-07-06 DIAGNOSIS — E119 Type 2 diabetes mellitus without complications: Secondary | ICD-10-CM | POA: Diagnosis not present

## 2019-07-12 DIAGNOSIS — N183 Chronic kidney disease, stage 3 unspecified: Secondary | ICD-10-CM | POA: Diagnosis not present

## 2019-07-12 DIAGNOSIS — G629 Polyneuropathy, unspecified: Secondary | ICD-10-CM | POA: Diagnosis not present

## 2019-07-12 DIAGNOSIS — N521 Erectile dysfunction due to diseases classified elsewhere: Secondary | ICD-10-CM | POA: Diagnosis not present

## 2019-07-12 DIAGNOSIS — I10 Essential (primary) hypertension: Secondary | ICD-10-CM | POA: Diagnosis not present

## 2019-07-12 DIAGNOSIS — E782 Mixed hyperlipidemia: Secondary | ICD-10-CM | POA: Diagnosis not present

## 2019-07-12 DIAGNOSIS — Z7984 Long term (current) use of oral hypoglycemic drugs: Secondary | ICD-10-CM | POA: Diagnosis not present

## 2019-07-12 DIAGNOSIS — E1122 Type 2 diabetes mellitus with diabetic chronic kidney disease: Secondary | ICD-10-CM | POA: Diagnosis not present

## 2019-08-01 DIAGNOSIS — Z125 Encounter for screening for malignant neoplasm of prostate: Secondary | ICD-10-CM | POA: Diagnosis not present

## 2019-08-01 DIAGNOSIS — N521 Erectile dysfunction due to diseases classified elsewhere: Secondary | ICD-10-CM | POA: Diagnosis not present

## 2019-08-01 DIAGNOSIS — Z7984 Long term (current) use of oral hypoglycemic drugs: Secondary | ICD-10-CM | POA: Diagnosis not present

## 2019-08-01 DIAGNOSIS — E782 Mixed hyperlipidemia: Secondary | ICD-10-CM | POA: Diagnosis not present

## 2019-08-01 DIAGNOSIS — Z0000 Encounter for general adult medical examination without abnormal findings: Secondary | ICD-10-CM | POA: Diagnosis not present

## 2019-08-01 DIAGNOSIS — E559 Vitamin D deficiency, unspecified: Secondary | ICD-10-CM | POA: Diagnosis not present

## 2019-08-01 DIAGNOSIS — E1122 Type 2 diabetes mellitus with diabetic chronic kidney disease: Secondary | ICD-10-CM | POA: Diagnosis not present

## 2019-08-12 DIAGNOSIS — N183 Chronic kidney disease, stage 3 unspecified: Secondary | ICD-10-CM | POA: Diagnosis not present

## 2019-08-12 DIAGNOSIS — I10 Essential (primary) hypertension: Secondary | ICD-10-CM | POA: Diagnosis not present

## 2019-08-12 DIAGNOSIS — E1122 Type 2 diabetes mellitus with diabetic chronic kidney disease: Secondary | ICD-10-CM | POA: Diagnosis not present

## 2019-08-12 DIAGNOSIS — E782 Mixed hyperlipidemia: Secondary | ICD-10-CM | POA: Diagnosis not present

## 2019-08-16 ENCOUNTER — Encounter: Payer: Self-pay | Admitting: *Deleted

## 2019-09-12 DIAGNOSIS — E1122 Type 2 diabetes mellitus with diabetic chronic kidney disease: Secondary | ICD-10-CM | POA: Diagnosis not present

## 2019-09-12 DIAGNOSIS — I10 Essential (primary) hypertension: Secondary | ICD-10-CM | POA: Diagnosis not present

## 2019-09-12 DIAGNOSIS — N183 Chronic kidney disease, stage 3 unspecified: Secondary | ICD-10-CM | POA: Diagnosis not present

## 2019-09-12 DIAGNOSIS — E782 Mixed hyperlipidemia: Secondary | ICD-10-CM | POA: Diagnosis not present

## 2019-11-12 DIAGNOSIS — I10 Essential (primary) hypertension: Secondary | ICD-10-CM | POA: Diagnosis not present

## 2019-11-12 DIAGNOSIS — N183 Chronic kidney disease, stage 3 unspecified: Secondary | ICD-10-CM | POA: Diagnosis not present

## 2019-11-12 DIAGNOSIS — E1122 Type 2 diabetes mellitus with diabetic chronic kidney disease: Secondary | ICD-10-CM | POA: Diagnosis not present

## 2019-11-12 DIAGNOSIS — E782 Mixed hyperlipidemia: Secondary | ICD-10-CM | POA: Diagnosis not present

## 2019-11-20 ENCOUNTER — Other Ambulatory Visit: Payer: Self-pay

## 2019-11-20 NOTE — Patient Outreach (Signed)
  [facility]) Care Management Chronic Special Needs Program    11/20/2019  Name: [name], DOB: [birth year]  MRN: [id number]   Mr. [name] is enrolled in a chronic special needs plan for Diabetes.  [place] Management will continue to provide services for this client through 01/12/2020. The [employer] care management team will assume care 01/13/2020.  Peter Garter RN, Jackquline Denmark, CDE Chronic Care Management Coordinator [place] [facility] [phone]

## 2019-11-28 DIAGNOSIS — L570 Actinic keratosis: Secondary | ICD-10-CM | POA: Diagnosis not present

## 2020-01-12 DIAGNOSIS — I10 Essential (primary) hypertension: Secondary | ICD-10-CM | POA: Diagnosis not present

## 2020-01-12 DIAGNOSIS — E782 Mixed hyperlipidemia: Secondary | ICD-10-CM | POA: Diagnosis not present

## 2020-01-12 DIAGNOSIS — E1122 Type 2 diabetes mellitus with diabetic chronic kidney disease: Secondary | ICD-10-CM | POA: Diagnosis not present

## 2020-01-12 DIAGNOSIS — N183 Chronic kidney disease, stage 3 unspecified: Secondary | ICD-10-CM | POA: Diagnosis not present

## 2020-01-18 ENCOUNTER — Other Ambulatory Visit: Payer: Self-pay

## 2020-01-23 DIAGNOSIS — L570 Actinic keratosis: Secondary | ICD-10-CM | POA: Diagnosis not present

## 2020-02-27 DIAGNOSIS — L570 Actinic keratosis: Secondary | ICD-10-CM | POA: Diagnosis not present

## 2020-03-05 DIAGNOSIS — E782 Mixed hyperlipidemia: Secondary | ICD-10-CM | POA: Diagnosis not present

## 2020-03-05 DIAGNOSIS — E1122 Type 2 diabetes mellitus with diabetic chronic kidney disease: Secondary | ICD-10-CM | POA: Diagnosis not present

## 2020-03-05 DIAGNOSIS — Z7984 Long term (current) use of oral hypoglycemic drugs: Secondary | ICD-10-CM | POA: Diagnosis not present

## 2020-03-09 DIAGNOSIS — N183 Chronic kidney disease, stage 3 unspecified: Secondary | ICD-10-CM | POA: Diagnosis not present

## 2020-03-09 DIAGNOSIS — I10 Essential (primary) hypertension: Secondary | ICD-10-CM | POA: Diagnosis not present

## 2020-03-09 DIAGNOSIS — E1122 Type 2 diabetes mellitus with diabetic chronic kidney disease: Secondary | ICD-10-CM | POA: Diagnosis not present

## 2020-03-09 DIAGNOSIS — E782 Mixed hyperlipidemia: Secondary | ICD-10-CM | POA: Diagnosis not present

## 2020-05-27 DIAGNOSIS — L578 Other skin changes due to chronic exposure to nonionizing radiation: Secondary | ICD-10-CM | POA: Diagnosis not present

## 2020-05-27 DIAGNOSIS — L905 Scar conditions and fibrosis of skin: Secondary | ICD-10-CM | POA: Diagnosis not present

## 2020-05-27 DIAGNOSIS — D225 Melanocytic nevi of trunk: Secondary | ICD-10-CM | POA: Diagnosis not present

## 2020-05-27 DIAGNOSIS — L821 Other seborrheic keratosis: Secondary | ICD-10-CM | POA: Diagnosis not present

## 2020-05-27 DIAGNOSIS — Z85828 Personal history of other malignant neoplasm of skin: Secondary | ICD-10-CM | POA: Diagnosis not present

## 2020-05-27 DIAGNOSIS — L814 Other melanin hyperpigmentation: Secondary | ICD-10-CM | POA: Diagnosis not present

## 2020-05-27 DIAGNOSIS — L570 Actinic keratosis: Secondary | ICD-10-CM | POA: Diagnosis not present

## 2020-05-29 ENCOUNTER — Ambulatory Visit: Payer: HMO

## 2020-06-02 DIAGNOSIS — E782 Mixed hyperlipidemia: Secondary | ICD-10-CM | POA: Diagnosis not present

## 2020-06-02 DIAGNOSIS — I10 Essential (primary) hypertension: Secondary | ICD-10-CM | POA: Diagnosis not present

## 2020-06-02 DIAGNOSIS — N183 Chronic kidney disease, stage 3 unspecified: Secondary | ICD-10-CM | POA: Diagnosis not present

## 2020-06-02 DIAGNOSIS — E1122 Type 2 diabetes mellitus with diabetic chronic kidney disease: Secondary | ICD-10-CM | POA: Diagnosis not present

## 2020-06-23 DIAGNOSIS — N183 Chronic kidney disease, stage 3 unspecified: Secondary | ICD-10-CM | POA: Diagnosis not present

## 2020-06-23 DIAGNOSIS — I10 Essential (primary) hypertension: Secondary | ICD-10-CM | POA: Diagnosis not present

## 2020-06-23 DIAGNOSIS — E782 Mixed hyperlipidemia: Secondary | ICD-10-CM | POA: Diagnosis not present

## 2020-06-23 DIAGNOSIS — E1122 Type 2 diabetes mellitus with diabetic chronic kidney disease: Secondary | ICD-10-CM | POA: Diagnosis not present

## 2020-08-01 DIAGNOSIS — E782 Mixed hyperlipidemia: Secondary | ICD-10-CM | POA: Diagnosis not present

## 2020-08-01 DIAGNOSIS — N183 Chronic kidney disease, stage 3 unspecified: Secondary | ICD-10-CM | POA: Diagnosis not present

## 2020-08-01 DIAGNOSIS — Z0000 Encounter for general adult medical examination without abnormal findings: Secondary | ICD-10-CM | POA: Diagnosis not present

## 2020-08-01 DIAGNOSIS — E1122 Type 2 diabetes mellitus with diabetic chronic kidney disease: Secondary | ICD-10-CM | POA: Diagnosis not present

## 2020-08-01 DIAGNOSIS — Z7984 Long term (current) use of oral hypoglycemic drugs: Secondary | ICD-10-CM | POA: Diagnosis not present

## 2020-08-01 DIAGNOSIS — Z125 Encounter for screening for malignant neoplasm of prostate: Secondary | ICD-10-CM | POA: Diagnosis not present

## 2020-08-01 DIAGNOSIS — I10 Essential (primary) hypertension: Secondary | ICD-10-CM | POA: Diagnosis not present

## 2020-09-03 DIAGNOSIS — I10 Essential (primary) hypertension: Secondary | ICD-10-CM | POA: Diagnosis not present

## 2020-11-04 DIAGNOSIS — I10 Essential (primary) hypertension: Secondary | ICD-10-CM | POA: Diagnosis not present

## 2020-11-04 DIAGNOSIS — E782 Mixed hyperlipidemia: Secondary | ICD-10-CM | POA: Diagnosis not present

## 2020-11-04 DIAGNOSIS — N183 Chronic kidney disease, stage 3 unspecified: Secondary | ICD-10-CM | POA: Diagnosis not present

## 2020-11-04 DIAGNOSIS — E1122 Type 2 diabetes mellitus with diabetic chronic kidney disease: Secondary | ICD-10-CM | POA: Diagnosis not present

## 2020-11-14 DIAGNOSIS — H2513 Age-related nuclear cataract, bilateral: Secondary | ICD-10-CM | POA: Diagnosis not present

## 2020-11-14 DIAGNOSIS — Z7984 Long term (current) use of oral hypoglycemic drugs: Secondary | ICD-10-CM | POA: Diagnosis not present

## 2020-11-14 DIAGNOSIS — E119 Type 2 diabetes mellitus without complications: Secondary | ICD-10-CM | POA: Diagnosis not present

## 2020-11-25 DIAGNOSIS — L570 Actinic keratosis: Secondary | ICD-10-CM | POA: Diagnosis not present

## 2020-12-30 DIAGNOSIS — L570 Actinic keratosis: Secondary | ICD-10-CM | POA: Diagnosis not present

## 2021-01-29 DIAGNOSIS — L570 Actinic keratosis: Secondary | ICD-10-CM | POA: Diagnosis not present

## 2021-02-14 DIAGNOSIS — E1122 Type 2 diabetes mellitus with diabetic chronic kidney disease: Secondary | ICD-10-CM | POA: Diagnosis not present

## 2021-02-14 DIAGNOSIS — F17209 Nicotine dependence, unspecified, with unspecified nicotine-induced disorders: Secondary | ICD-10-CM | POA: Diagnosis not present

## 2021-02-14 DIAGNOSIS — Z7984 Long term (current) use of oral hypoglycemic drugs: Secondary | ICD-10-CM | POA: Diagnosis not present

## 2021-02-14 DIAGNOSIS — I10 Essential (primary) hypertension: Secondary | ICD-10-CM | POA: Diagnosis not present

## 2021-02-14 DIAGNOSIS — E782 Mixed hyperlipidemia: Secondary | ICD-10-CM | POA: Diagnosis not present

## 2021-03-25 DIAGNOSIS — L570 Actinic keratosis: Secondary | ICD-10-CM | POA: Diagnosis not present

## 2021-08-11 DIAGNOSIS — Z0000 Encounter for general adult medical examination without abnormal findings: Secondary | ICD-10-CM | POA: Diagnosis not present

## 2021-08-11 DIAGNOSIS — E782 Mixed hyperlipidemia: Secondary | ICD-10-CM | POA: Diagnosis not present

## 2021-08-11 DIAGNOSIS — I10 Essential (primary) hypertension: Secondary | ICD-10-CM | POA: Diagnosis not present

## 2021-08-11 DIAGNOSIS — Z1211 Encounter for screening for malignant neoplasm of colon: Secondary | ICD-10-CM | POA: Diagnosis not present

## 2021-08-11 DIAGNOSIS — E1122 Type 2 diabetes mellitus with diabetic chronic kidney disease: Secondary | ICD-10-CM | POA: Diagnosis not present

## 2021-08-11 DIAGNOSIS — F17200 Nicotine dependence, unspecified, uncomplicated: Secondary | ICD-10-CM | POA: Diagnosis not present

## 2021-08-11 DIAGNOSIS — N183 Chronic kidney disease, stage 3 unspecified: Secondary | ICD-10-CM | POA: Diagnosis not present

## 2021-08-11 DIAGNOSIS — G629 Polyneuropathy, unspecified: Secondary | ICD-10-CM | POA: Diagnosis not present

## 2021-10-07 DIAGNOSIS — D225 Melanocytic nevi of trunk: Secondary | ICD-10-CM | POA: Diagnosis not present

## 2021-10-07 DIAGNOSIS — L814 Other melanin hyperpigmentation: Secondary | ICD-10-CM | POA: Diagnosis not present

## 2021-10-07 DIAGNOSIS — Z09 Encounter for follow-up examination after completed treatment for conditions other than malignant neoplasm: Secondary | ICD-10-CM | POA: Diagnosis not present

## 2021-10-07 DIAGNOSIS — L570 Actinic keratosis: Secondary | ICD-10-CM | POA: Diagnosis not present

## 2022-02-11 DIAGNOSIS — L570 Actinic keratosis: Secondary | ICD-10-CM | POA: Diagnosis not present

## 2022-02-11 DIAGNOSIS — L821 Other seborrheic keratosis: Secondary | ICD-10-CM | POA: Diagnosis not present

## 2022-02-11 DIAGNOSIS — L814 Other melanin hyperpigmentation: Secondary | ICD-10-CM | POA: Diagnosis not present

## 2022-03-09 DIAGNOSIS — E1165 Type 2 diabetes mellitus with hyperglycemia: Secondary | ICD-10-CM | POA: Diagnosis not present

## 2022-03-09 DIAGNOSIS — Z7984 Long term (current) use of oral hypoglycemic drugs: Secondary | ICD-10-CM | POA: Diagnosis not present

## 2022-03-09 DIAGNOSIS — E782 Mixed hyperlipidemia: Secondary | ICD-10-CM | POA: Diagnosis not present

## 2022-03-09 DIAGNOSIS — I10 Essential (primary) hypertension: Secondary | ICD-10-CM | POA: Diagnosis not present

## 2022-03-09 DIAGNOSIS — Z122 Encounter for screening for malignant neoplasm of respiratory organs: Secondary | ICD-10-CM | POA: Diagnosis not present

## 2022-03-11 ENCOUNTER — Other Ambulatory Visit (HOSPITAL_BASED_OUTPATIENT_CLINIC_OR_DEPARTMENT_OTHER): Payer: Self-pay | Admitting: Family Medicine

## 2022-03-11 DIAGNOSIS — F17210 Nicotine dependence, cigarettes, uncomplicated: Secondary | ICD-10-CM

## 2022-04-17 ENCOUNTER — Ambulatory Visit (HOSPITAL_BASED_OUTPATIENT_CLINIC_OR_DEPARTMENT_OTHER)
Admission: RE | Admit: 2022-04-17 | Discharge: 2022-04-17 | Disposition: A | Discharge status: Home / Self Care | Payer: PPO | Source: Ambulatory Visit | Attending: Family Medicine | Admitting: Family Medicine

## 2022-04-17 DIAGNOSIS — F17210 Nicotine dependence, cigarettes, uncomplicated: Secondary | ICD-10-CM | POA: Diagnosis not present

## 2022-05-05 DIAGNOSIS — H25013 Cortical age-related cataract, bilateral: Secondary | ICD-10-CM | POA: Diagnosis not present

## 2022-05-05 DIAGNOSIS — H18413 Arcus senilis, bilateral: Secondary | ICD-10-CM | POA: Diagnosis not present

## 2022-05-05 DIAGNOSIS — H25043 Posterior subcapsular polar age-related cataract, bilateral: Secondary | ICD-10-CM | POA: Diagnosis not present

## 2022-05-05 DIAGNOSIS — H2513 Age-related nuclear cataract, bilateral: Secondary | ICD-10-CM | POA: Diagnosis not present

## 2022-05-13 DIAGNOSIS — E1136 Type 2 diabetes mellitus with diabetic cataract: Secondary | ICD-10-CM | POA: Diagnosis not present

## 2022-05-13 DIAGNOSIS — I10 Essential (primary) hypertension: Secondary | ICD-10-CM | POA: Diagnosis not present

## 2022-05-13 DIAGNOSIS — F17200 Nicotine dependence, unspecified, uncomplicated: Secondary | ICD-10-CM | POA: Diagnosis not present

## 2022-05-13 DIAGNOSIS — E1122 Type 2 diabetes mellitus with diabetic chronic kidney disease: Secondary | ICD-10-CM | POA: Diagnosis not present

## 2022-05-13 DIAGNOSIS — I6529 Occlusion and stenosis of unspecified carotid artery: Secondary | ICD-10-CM | POA: Diagnosis not present

## 2022-05-13 DIAGNOSIS — N183 Chronic kidney disease, stage 3 unspecified: Secondary | ICD-10-CM | POA: Diagnosis not present

## 2022-05-13 DIAGNOSIS — Z01810 Encounter for preprocedural cardiovascular examination: Secondary | ICD-10-CM | POA: Diagnosis not present

## 2022-05-13 DIAGNOSIS — E782 Mixed hyperlipidemia: Secondary | ICD-10-CM | POA: Diagnosis not present

## 2022-05-14 ENCOUNTER — Other Ambulatory Visit (HOSPITAL_BASED_OUTPATIENT_CLINIC_OR_DEPARTMENT_OTHER): Payer: Self-pay | Admitting: Family Medicine

## 2022-05-14 DIAGNOSIS — I6529 Occlusion and stenosis of unspecified carotid artery: Secondary | ICD-10-CM

## 2022-05-27 ENCOUNTER — Ambulatory Visit (HOSPITAL_BASED_OUTPATIENT_CLINIC_OR_DEPARTMENT_OTHER)
Admission: RE | Admit: 2022-05-27 | Discharge: 2022-05-27 | Disposition: A | Discharge status: Home / Self Care | Payer: PPO | Source: Ambulatory Visit | Attending: Family Medicine | Admitting: Family Medicine

## 2022-05-27 DIAGNOSIS — I6529 Occlusion and stenosis of unspecified carotid artery: Secondary | ICD-10-CM | POA: Insufficient documentation

## 2022-05-27 DIAGNOSIS — I6523 Occlusion and stenosis of bilateral carotid arteries: Secondary | ICD-10-CM | POA: Diagnosis not present

## 2022-07-22 DIAGNOSIS — H2512 Age-related nuclear cataract, left eye: Secondary | ICD-10-CM | POA: Diagnosis not present

## 2022-07-23 DIAGNOSIS — H2511 Age-related nuclear cataract, right eye: Secondary | ICD-10-CM | POA: Diagnosis not present

## 2022-07-23 DIAGNOSIS — H2512 Age-related nuclear cataract, left eye: Secondary | ICD-10-CM | POA: Diagnosis not present

## 2022-08-05 DIAGNOSIS — H2511 Age-related nuclear cataract, right eye: Secondary | ICD-10-CM | POA: Diagnosis not present

## 2022-08-06 DIAGNOSIS — H2511 Age-related nuclear cataract, right eye: Secondary | ICD-10-CM | POA: Diagnosis not present

## 2022-09-01 ENCOUNTER — Encounter (HOSPITAL_BASED_OUTPATIENT_CLINIC_OR_DEPARTMENT_OTHER): Payer: Self-pay | Admitting: Cardiology

## 2022-09-01 ENCOUNTER — Ambulatory Visit (HOSPITAL_BASED_OUTPATIENT_CLINIC_OR_DEPARTMENT_OTHER): Payer: PPO | Admitting: Cardiology

## 2022-09-01 VITALS — BP 136/50 | HR 58 | Ht 70.0 in | Wt 159.7 lb

## 2022-09-01 DIAGNOSIS — I10 Essential (primary) hypertension: Secondary | ICD-10-CM | POA: Diagnosis not present

## 2022-09-01 DIAGNOSIS — N183 Chronic kidney disease, stage 3 unspecified: Secondary | ICD-10-CM | POA: Diagnosis not present

## 2022-09-01 DIAGNOSIS — I6522 Occlusion and stenosis of left carotid artery: Secondary | ICD-10-CM

## 2022-09-01 DIAGNOSIS — E782 Mixed hyperlipidemia: Secondary | ICD-10-CM

## 2022-09-01 DIAGNOSIS — F17210 Nicotine dependence, cigarettes, uncomplicated: Secondary | ICD-10-CM

## 2022-09-01 DIAGNOSIS — Z716 Tobacco abuse counseling: Secondary | ICD-10-CM

## 2022-09-01 DIAGNOSIS — E1122 Type 2 diabetes mellitus with diabetic chronic kidney disease: Secondary | ICD-10-CM | POA: Diagnosis not present

## 2022-09-01 DIAGNOSIS — Z7189 Other specified counseling: Secondary | ICD-10-CM | POA: Diagnosis not present

## 2022-09-01 DIAGNOSIS — Z7984 Long term (current) use of oral hypoglycemic drugs: Secondary | ICD-10-CM

## 2022-09-01 MED ORDER — ROSUVASTATIN CALCIUM 20 MG PO TABS: 20.0000 mg | ORAL_TABLET | Freq: Every day | ORAL | 3 refills | Status: AC

## 2022-09-01 NOTE — Patient Instructions (Addendum)
Stop the simvastatin and replace with the rosuvastatin. We will recheck cholesterol in 3 mos.   FOLLOW UP IN 1 YEAR   What is the Mediterranean diet?  The Mediterranean-style diet has fewer meats and carbohydrates than a typical American diet. It also has more plant-based foods and monounsaturated (good) fat. People who live in [place], [place], and other countries in the Mediterranean region have eaten this way for centuries.  Following the Mediterranean diet may lead to more stable blood sugar, lower cholesterol and triglycerides, and a lower risk for heart disease and other health problems.  How to Follow the Diet   The Mediterranean diet is based on:   Plant-based meals, with just small amounts of lean meat and chicken  More servings of whole grains, fresh fruits and vegetables, nuts, and legumes  Foods that naturally contain high amounts of fiber  Plenty of fish and other seafood  Olive oil as the main source of fat for preparing food. Olive oil is a healthy, monounsaturated fat  Food that is prepared and seasoned simply, without sauces and gravies  Foods Not in the Diet  Foods that are eaten in small amounts or NOT at all in the Mediterranean diet include:   Red meats  Sweets and other desserts  Eggs  Butter  Possible Health Concerns  There may be health concerns with this eating style for some people, including:   You may gain weight from eating fats in olive oil and nuts.  You may have lower levels of iron. If you choose to follow the Mediterranean diet, be sure to eat some foods rich in iron or in vitamin C, which helps your body absorb iron.  You may have calcium loss from eating fewer dairy products. Ask your health care provider if you should take a calcium supplement.  Wine is a common part of a Mediterranean eating style but some people should not drink alcohol. Avoid wine if you are prone to alcohol abuse, pregnant, at risk for breast cancer, or have  other conditions that alcohol could make worse.  References  [facility], [place] Harolyn Rutherford, et al. 2013 AHA/ACC guideline on lifestyle management to reduce cardiovascular risk: a report of the [employer] of Cardiology/American Heart Association Task Force on practice guidelines. J Am Coll Cardiol. 4098;11(91 Pt B):2960-2984. PMID: 47829562 WikiTutors.com.ee.  Mozaffarian D. Nutrition and cardiovascular and metabolic diseases. In: [place], [place], [address], [place], [place], [place]. [place]'s Heart Disease: A Textbook of Cardiovascular Medicine. 10th ed. [place], [place]: [employer]; 2015:chap 46.  Copied from: MediaSuits.se

## 2022-09-01 NOTE — Progress Notes (Signed)
Cardiology Office Note:  .    Date:  09/01/2022  ID:  Harrell Gave, DOB 01/28/1950, MRN [id number] PCP: Koren Shiver, DO  [facility] Providers Cardiologist:  Jodelle Red, MD     History of Present Illness: .    Joseph Sweeney is a 72 y.o. male with a hx of nonobstructive CAD, aortic atherosclerosis, carotid artery stenosis, hypertension, hyperlipidemia, type II diabetes, here for the evaluation of carotid stenosis. He is self-referred.  Today he is accompanied by a family member. His main concerns today is his stroke risk, and the level of exercise that he should achieve. We reviewed his CT and carotid ultrasound in detail. Generally he tolerates his medications well. However, they note that he does feel sleepy often, especially after eating. Additionally he complains of some dizziness, no loss of consciousness. He walks slower out of caution.  Cardiovascular risk factors: Prior clinical ASCVD: Coronary artery calcification, consistent with at least nonobstructive CAD. Carotid artery stenosis. Current regimen includes aspirin, fenofibrate. No prior heart attack or stroke. Comorbid conditions:  Hyperlipidemia - currently on simvastatin. Initially had elevated triglycerides at age 71. Type II diabetes - since his early 18's; currently on metformin, Januvia, saxagliptin. Hypertension - he has been on losartan for about 1 to 1.5 years, onset in the setting of retirement. In the office today his blood pressure is 140/58 initially, and 136/50 on manual recheck. Metabolic syndrome/Obesity:  Current weight 159 lbs. Chronic inflammatory conditions: None Tobacco use history: He has been smoking since he was 49-29 years old. Currently he smokes 5-7 cigarettes a day, working on decreasing this. Longest quit time of 1.5 years previously, had participated in a [employer] program. Family history: His father had a CABG x3 prior to age 68; died in his 72s. His mother is living in her  mid-80s with a history of congestive heart failure and has been diagnosed with an abdominal aneurysm, would not survive surgery. His grandfather had diabetes. Prior pertinent testing and/or incidental findings: Nonobstructive CAD seen on CT 04/2022. Ultrasound 05/2022 with L ICA 50-69% stenosis and notable plaque, R ICA without significant stenosis but plaque present. Exercise level: Was swimming at the [facility] until his cataracts. Current diet:  He denies any palpitations, chest pain, shortness of breath, peripheral edema, headaches, orthopnea, or PND.  ROS:  Please see the history of present illness. ROS otherwise negative except as noted.  (+) Daytime somnolence (+) Dizziness  Studies Reviewed: Marland Kitchen    EKG Interpretation Date/Time:  Tuesday September 01 2022 09:46:42 EDT Ventricular Rate:  58 PR Interval:  168 QRS Duration:  88 QT Interval:  420 QTC Calculation: 412 R Axis:   50  Text Interpretation: Sinus bradycardia Confirmed by Jodelle Red [phone]) on 09/01/2022 9:49:46 AM    Bilateral Carotid Dopplers  05/27/2022: IMPRESSION: 1. Moderate amount of left-sided atherosclerotic plaque results in elevated peak systolic velocities within the proximal left ICA compatible with the 50-69% luminal narrowing range. Further evaluation with CTA could be performed as indicated. 2. Moderate amount of right-sided atherosclerotic plaque, not resulting in a hemodynamically significant stenosis. 3. Mild asymmetry between the bilateral upper extremity blood pressures with preservation of antegrade flow within the bilateral vertebral arteries.  CT Chest  04/17/2022: IMPRESSION: 1. Lung-RADS 2S, benign appearance or behavior. Continue annual screening with low-dose chest CT without contrast in 12 months. S modifier for coronary artery calcifications. 2. Severe coronary artery calcifications, recommend ASCVD risk assessment. 3. Aortic Atherosclerosis (ICD10-I70.0) and Emphysema  (ICD10-J43.9).  Physical  Exam:    VS:  BP (!) 136/50 (BP Location: Left Arm, Patient Position: Sitting, Cuff Size: Normal)   Pulse (!) 58   Ht 5\' 10"  (1.778 m)   Wt 159 lb 11.2 oz (72.4 kg)   BMI 22.91 kg/m    Wt Readings from Last 3 Encounters:  09/01/22 159 lb 11.2 oz (72.4 kg)    GEN: Well nourished, well developed in no acute distress HEENT: Normal, moist mucous membranes NECK: No JVD CARDIAC: regular rhythm, normal S1 and S2, no rubs or gallops. No murmur. VASCULAR: Radial and DP pulses 2+ bilaterally. No carotid bruits appreciated today. RESPIRATORY:  Clear to auscultation without rales, wheezing or rhonchi  ABDOMEN: Soft, non-tender, non-distended MUSCULOSKELETAL:  Ambulates independently SKIN: Warm and dry, no edema NEUROLOGIC:  Alert and oriented x 3. No focal neuro deficits noted. PSYCHIATRIC:  Normal affect   ASSESSMENT AND PLAN: .    Coronary artery calcification, consistent with at least nonobstructive CAD Hyperlipidemia -seen on CT 04/2022 -on aspirin -currently on simvastatin. Given ASCVD (CAD and carotid), would intensify statin. Will change to rosvuastatin 20 mg daily, recheck lipids in 3 mos -on fenofibrate -had labs at [facility]. Tchol 144, hdl 41, ldl 69, tg212 02/2022 -with diabetes, LDL goal <55  Carotid artery stenosis -u/s 05/2022 with L ICA 50-69% stenosis and notable plaque, R ICA without significant stenosis but plaque present -intensifying statin as above -counseled on monitoring for stroke symptoms  Type II diabetes, with CKD -currently on metformin, DPP4 (gliptin) -would recommend SGLT2i or GLP1RA given ASCVD. Given BMI 22, would start with SGLT2i -A1c 6.2  Hypertension -improved on recheck -continue losartan  Tobacco use: The patient was counseled on tobacco cessation today for 5 minutes.  Counseling included reviewing the risks of smoking tobacco products, how it impacts the patient's current medical diagnoses and different strategies for  quitting.  Pharmacotherapy to aid in tobacco cessation was not prescribed today.   CV risk counseling and prevention -recommend heart healthy/Mediterranean diet, with whole grains, fruits, vegetable, fish, lean meats, nuts, and olive oil. Limit salt. -recommend moderate walking, 3-5 times/week for 30-50 minutes each session. Aim for at least 150 minutes.week. Goal should be pace of 3 miles/hours, or walking 1.5 miles in 30 minutes -recommend avoidance of tobacco products. Avoid excess alcohol.  Dispo: Follow-up in 1 year, or sooner as needed.  I,Mathew Stumpf,acting as a Neurosurgeon for [employer], MD.,have documented all relevant documentation on the behalf of Jodelle Red, MD,as directed by  Jodelle Red, MD while in the presence of Jodelle Red, MD.  I, Jodelle Red, MD, have reviewed all documentation for this visit. The documentation on 09/01/22 for the exam, diagnosis, procedures, and orders are all accurate and complete.   Signed, Jodelle Red, MD

## 2022-09-12 DIAGNOSIS — R439 Unspecified disturbances of smell and taste: Secondary | ICD-10-CM | POA: Diagnosis not present

## 2022-09-12 DIAGNOSIS — J309 Allergic rhinitis, unspecified: Secondary | ICD-10-CM | POA: Diagnosis not present

## 2022-09-12 DIAGNOSIS — Z20822 Contact with and (suspected) exposure to covid-19: Secondary | ICD-10-CM | POA: Diagnosis not present

## 2022-09-12 DIAGNOSIS — R0602 Shortness of breath: Secondary | ICD-10-CM | POA: Diagnosis not present

## 2022-09-15 DIAGNOSIS — H52223 Regular astigmatism, bilateral: Secondary | ICD-10-CM | POA: Diagnosis not present

## 2022-09-16 DIAGNOSIS — R0981 Nasal congestion: Secondary | ICD-10-CM | POA: Diagnosis not present

## 2022-09-16 DIAGNOSIS — Z20822 Contact with and (suspected) exposure to covid-19: Secondary | ICD-10-CM | POA: Diagnosis not present

## 2022-09-16 DIAGNOSIS — J309 Allergic rhinitis, unspecified: Secondary | ICD-10-CM | POA: Diagnosis not present

## 2022-09-16 DIAGNOSIS — R439 Unspecified disturbances of smell and taste: Secondary | ICD-10-CM | POA: Diagnosis not present

## 2022-09-16 DIAGNOSIS — R0602 Shortness of breath: Secondary | ICD-10-CM | POA: Diagnosis not present

## 2022-09-21 DIAGNOSIS — N183 Chronic kidney disease, stage 3 unspecified: Secondary | ICD-10-CM | POA: Diagnosis not present

## 2022-09-21 DIAGNOSIS — Z1211 Encounter for screening for malignant neoplasm of colon: Secondary | ICD-10-CM | POA: Diagnosis not present

## 2022-09-21 DIAGNOSIS — R0981 Nasal congestion: Secondary | ICD-10-CM | POA: Diagnosis not present

## 2022-09-21 DIAGNOSIS — I700 Atherosclerosis of aorta: Secondary | ICD-10-CM | POA: Diagnosis not present

## 2022-09-21 DIAGNOSIS — J439 Emphysema, unspecified: Secondary | ICD-10-CM | POA: Diagnosis not present

## 2022-09-21 DIAGNOSIS — I10 Essential (primary) hypertension: Secondary | ICD-10-CM | POA: Diagnosis not present

## 2022-09-21 DIAGNOSIS — I6522 Occlusion and stenosis of left carotid artery: Secondary | ICD-10-CM | POA: Diagnosis not present

## 2022-09-21 DIAGNOSIS — Z20822 Contact with and (suspected) exposure to covid-19: Secondary | ICD-10-CM | POA: Diagnosis not present

## 2022-09-21 DIAGNOSIS — E782 Mixed hyperlipidemia: Secondary | ICD-10-CM | POA: Diagnosis not present

## 2022-09-21 DIAGNOSIS — Z23 Encounter for immunization: Secondary | ICD-10-CM | POA: Diagnosis not present

## 2022-09-21 DIAGNOSIS — E1122 Type 2 diabetes mellitus with diabetic chronic kidney disease: Secondary | ICD-10-CM | POA: Diagnosis not present

## 2022-09-21 DIAGNOSIS — R0602 Shortness of breath: Secondary | ICD-10-CM | POA: Diagnosis not present

## 2022-09-21 DIAGNOSIS — Z122 Encounter for screening for malignant neoplasm of respiratory organs: Secondary | ICD-10-CM | POA: Diagnosis not present

## 2022-09-21 DIAGNOSIS — F17209 Nicotine dependence, unspecified, with unspecified nicotine-induced disorders: Secondary | ICD-10-CM | POA: Diagnosis not present

## 2022-09-21 DIAGNOSIS — J309 Allergic rhinitis, unspecified: Secondary | ICD-10-CM | POA: Diagnosis not present

## 2022-09-21 DIAGNOSIS — R439 Unspecified disturbances of smell and taste: Secondary | ICD-10-CM | POA: Diagnosis not present

## 2022-09-21 DIAGNOSIS — Z0000 Encounter for general adult medical examination without abnormal findings: Secondary | ICD-10-CM | POA: Diagnosis not present

## 2022-09-24 DIAGNOSIS — Z20822 Contact with and (suspected) exposure to covid-19: Secondary | ICD-10-CM | POA: Diagnosis not present

## 2022-09-24 DIAGNOSIS — R439 Unspecified disturbances of smell and taste: Secondary | ICD-10-CM | POA: Diagnosis not present

## 2022-09-24 DIAGNOSIS — R0981 Nasal congestion: Secondary | ICD-10-CM | POA: Diagnosis not present

## 2022-09-24 DIAGNOSIS — R0602 Shortness of breath: Secondary | ICD-10-CM | POA: Diagnosis not present

## 2022-09-24 DIAGNOSIS — J309 Allergic rhinitis, unspecified: Secondary | ICD-10-CM | POA: Diagnosis not present

## 2022-09-28 DIAGNOSIS — R439 Unspecified disturbances of smell and taste: Secondary | ICD-10-CM | POA: Diagnosis not present

## 2022-09-28 DIAGNOSIS — R0602 Shortness of breath: Secondary | ICD-10-CM | POA: Diagnosis not present

## 2022-09-28 DIAGNOSIS — Z20822 Contact with and (suspected) exposure to covid-19: Secondary | ICD-10-CM | POA: Diagnosis not present

## 2022-09-28 DIAGNOSIS — R0981 Nasal congestion: Secondary | ICD-10-CM | POA: Diagnosis not present

## 2022-09-28 DIAGNOSIS — J309 Allergic rhinitis, unspecified: Secondary | ICD-10-CM | POA: Diagnosis not present

## 2022-10-01 DIAGNOSIS — R0602 Shortness of breath: Secondary | ICD-10-CM | POA: Diagnosis not present

## 2022-10-01 DIAGNOSIS — J309 Allergic rhinitis, unspecified: Secondary | ICD-10-CM | POA: Diagnosis not present

## 2022-10-01 DIAGNOSIS — R439 Unspecified disturbances of smell and taste: Secondary | ICD-10-CM | POA: Diagnosis not present

## 2022-10-01 DIAGNOSIS — Z20822 Contact with and (suspected) exposure to covid-19: Secondary | ICD-10-CM | POA: Diagnosis not present

## 2022-10-01 DIAGNOSIS — R0981 Nasal congestion: Secondary | ICD-10-CM | POA: Diagnosis not present

## 2022-10-06 DIAGNOSIS — R0602 Shortness of breath: Secondary | ICD-10-CM | POA: Diagnosis not present

## 2022-10-06 DIAGNOSIS — R439 Unspecified disturbances of smell and taste: Secondary | ICD-10-CM | POA: Diagnosis not present

## 2022-10-06 DIAGNOSIS — Z20822 Contact with and (suspected) exposure to covid-19: Secondary | ICD-10-CM | POA: Diagnosis not present

## 2022-10-06 DIAGNOSIS — J309 Allergic rhinitis, unspecified: Secondary | ICD-10-CM | POA: Diagnosis not present

## 2022-10-06 DIAGNOSIS — R0981 Nasal congestion: Secondary | ICD-10-CM | POA: Diagnosis not present

## 2022-10-09 DIAGNOSIS — J309 Allergic rhinitis, unspecified: Secondary | ICD-10-CM | POA: Diagnosis not present

## 2022-10-09 DIAGNOSIS — Z20822 Contact with and (suspected) exposure to covid-19: Secondary | ICD-10-CM | POA: Diagnosis not present

## 2022-10-09 DIAGNOSIS — R0602 Shortness of breath: Secondary | ICD-10-CM | POA: Diagnosis not present

## 2022-10-09 DIAGNOSIS — R0981 Nasal congestion: Secondary | ICD-10-CM | POA: Diagnosis not present

## 2022-10-09 DIAGNOSIS — R439 Unspecified disturbances of smell and taste: Secondary | ICD-10-CM | POA: Diagnosis not present

## 2022-10-28 DIAGNOSIS — L820 Inflamed seborrheic keratosis: Secondary | ICD-10-CM | POA: Diagnosis not present

## 2022-10-28 DIAGNOSIS — L538 Other specified erythematous conditions: Secondary | ICD-10-CM | POA: Diagnosis not present

## 2022-10-28 DIAGNOSIS — L218 Other seborrheic dermatitis: Secondary | ICD-10-CM | POA: Diagnosis not present

## 2022-10-28 DIAGNOSIS — L570 Actinic keratosis: Secondary | ICD-10-CM | POA: Diagnosis not present

## 2022-10-28 DIAGNOSIS — L2989 Other pruritus: Secondary | ICD-10-CM | POA: Diagnosis not present

## 2022-11-25 DIAGNOSIS — N1831 Chronic kidney disease, stage 3a: Secondary | ICD-10-CM | POA: Diagnosis not present

## 2022-11-25 DIAGNOSIS — E1122 Type 2 diabetes mellitus with diabetic chronic kidney disease: Secondary | ICD-10-CM | POA: Diagnosis not present

## 2023-02-17 ENCOUNTER — Other Ambulatory Visit (HOSPITAL_BASED_OUTPATIENT_CLINIC_OR_DEPARTMENT_OTHER): Payer: Self-pay | Admitting: Family Medicine

## 2023-02-17 DIAGNOSIS — I6522 Occlusion and stenosis of left carotid artery: Secondary | ICD-10-CM

## 2023-02-26 ENCOUNTER — Ambulatory Visit (HOSPITAL_BASED_OUTPATIENT_CLINIC_OR_DEPARTMENT_OTHER)
Admission: RE | Admit: 2023-02-26 | Discharge: 2023-02-26 | Disposition: A | Discharge status: Home / Self Care | Payer: PPO | Source: Ambulatory Visit | Attending: Family Medicine | Admitting: Family Medicine

## 2023-02-26 DIAGNOSIS — I6522 Occlusion and stenosis of left carotid artery: Secondary | ICD-10-CM | POA: Diagnosis present

## 2023-02-26 LAB — POCT I-STAT CREATININE: Creatinine, Ser: 1.9 mg/dL — ABNORMAL HIGH (ref 0.61–1.24)

## 2023-02-26 MED ORDER — IOHEXOL 350 MG/ML SOLN
60.0000 mL | Freq: Once | INTRAVENOUS | Status: AC | PRN
  Administered 2023-02-26: 60 mL via INTRAVENOUS

## 2023-03-02 ENCOUNTER — Other Ambulatory Visit: Payer: Self-pay | Admitting: Nurse Practitioner

## 2023-03-02 DIAGNOSIS — R634 Abnormal weight loss: Secondary | ICD-10-CM

## 2023-03-09 ENCOUNTER — Ambulatory Visit
Admission: RE | Admit: 2023-03-09 | Discharge: 2023-03-09 | Disposition: A | Discharge status: Home / Self Care | Payer: PPO | Source: Ambulatory Visit | Attending: Nurse Practitioner | Admitting: Nurse Practitioner

## 2023-03-09 DIAGNOSIS — R634 Abnormal weight loss: Secondary | ICD-10-CM

## 2023-03-09 MED ORDER — IOPAMIDOL (ISOVUE-300) INJECTION 61%
80.0000 mL | Freq: Once | INTRAVENOUS | Status: AC | PRN
  Administered 2023-03-09: 80 mL via INTRAVENOUS

## 2023-03-16 DIAGNOSIS — K648 Other hemorrhoids: Secondary | ICD-10-CM | POA: Diagnosis not present

## 2023-03-16 DIAGNOSIS — Z860101 Personal history of adenomatous and serrated colon polyps: Secondary | ICD-10-CM | POA: Diagnosis not present

## 2023-03-16 DIAGNOSIS — Z09 Encounter for follow-up examination after completed treatment for conditions other than malignant neoplasm: Secondary | ICD-10-CM | POA: Diagnosis not present

## 2023-03-16 DIAGNOSIS — K5730 Diverticulosis of large intestine without perforation or abscess without bleeding: Secondary | ICD-10-CM | POA: Diagnosis not present

## 2023-03-26 DIAGNOSIS — Z720 Tobacco use: Secondary | ICD-10-CM | POA: Diagnosis not present

## 2023-03-26 DIAGNOSIS — I6522 Occlusion and stenosis of left carotid artery: Secondary | ICD-10-CM | POA: Diagnosis not present

## 2023-03-26 DIAGNOSIS — Z85828 Personal history of other malignant neoplasm of skin: Secondary | ICD-10-CM | POA: Diagnosis not present

## 2023-03-26 DIAGNOSIS — J342 Deviated nasal septum: Secondary | ICD-10-CM | POA: Diagnosis not present

## 2023-03-26 DIAGNOSIS — D490 Neoplasm of unspecified behavior of digestive system: Secondary | ICD-10-CM | POA: Diagnosis not present

## 2023-03-29 ENCOUNTER — Other Ambulatory Visit (HOSPITAL_COMMUNITY): Payer: Self-pay | Admitting: Otolaryngology

## 2023-03-29 DIAGNOSIS — D490 Neoplasm of unspecified behavior of digestive system: Secondary | ICD-10-CM

## 2023-03-30 NOTE — Progress Notes (Signed)
 Joseph Lack, MD  [name] PROCEDURE / BIOPSY REVIEW Date: 03/30/23  Requested Biopsy site: Right parotid mass Reason for request: Parotid lesion Imaging review: Best seen on CTA of neck  Decision: Approved Imaging modality to perform: Ultrasound Schedule with: No sedation / Local anesthetic Schedule for: Any VIR  Additional comments: @VIR : FNA only. Quite deep lesion and may be difficult to see by [place]. @Schedulers . FNA only, so will need Cytology.  Please contact me with questions, concerns, or if issue pertaining to this request arise.  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

## 2023-03-31 ENCOUNTER — Encounter: Payer: Self-pay | Admitting: *Deleted

## 2023-03-31 NOTE — Progress Notes (Unsigned)
 Bennie Dallas, MD  [name] PROCEDURE / BIOPSY REVIEW Date: 03/31/23  Requested Biopsy site: Right parotid mass Reason for request: indeterminate mass Imaging review: Best seen on CT neck 02/26/23  Decision: Approved Imaging modality to perform: Ultrasound Schedule with: No sedation / Local anesthetic Schedule for: Any VIR  Additional comments: Deep parotid mass, would recommend FNA  Please contact me with questions, concerns, or if issue pertaining to this request arise.  Bennie Dallas, MD [facility] Radiology

## 2023-04-15 ENCOUNTER — Other Ambulatory Visit: Payer: Self-pay | Admitting: Student

## 2023-04-15 DIAGNOSIS — K118 Other diseases of salivary glands: Secondary | ICD-10-CM

## 2023-04-16 ENCOUNTER — Ambulatory Visit (HOSPITAL_COMMUNITY)
Admission: RE | Admit: 2023-04-16 | Discharge: 2023-04-16 | Disposition: A | Discharge status: Home / Self Care | Source: Ambulatory Visit | Attending: Otolaryngology | Admitting: Otolaryngology

## 2023-04-16 ENCOUNTER — Other Ambulatory Visit (HOSPITAL_BASED_OUTPATIENT_CLINIC_OR_DEPARTMENT_OTHER): Payer: Self-pay | Admitting: Family Medicine

## 2023-04-16 DIAGNOSIS — Z122 Encounter for screening for malignant neoplasm of respiratory organs: Secondary | ICD-10-CM

## 2023-04-16 DIAGNOSIS — D490 Neoplasm of unspecified behavior of digestive system: Secondary | ICD-10-CM | POA: Diagnosis present

## 2023-04-16 DIAGNOSIS — K118 Other diseases of salivary glands: Secondary | ICD-10-CM | POA: Diagnosis not present

## 2023-04-16 DIAGNOSIS — D37030 Neoplasm of uncertain behavior of the parotid salivary glands: Secondary | ICD-10-CM | POA: Diagnosis not present

## 2023-04-16 NOTE — Procedures (Signed)
 Vascular and Interventional Radiology Procedure Note  Patient: Joseph Sweeney DOB: 17-Aug-1950 Medical Record Number: [id number] Note Date/Time: 04/16/23 12:13 PM   Performing Physician: Roanna Banning, MD Assistant(s): None  Diagnosis: R parotid mass  Procedure: RIGHT PAROTID MASS FINE NEEDLE ASPIRATION  Anesthesia: Local Anesthetic Complications: None Estimated Blood Loss: Minimal Specimens: Sent for Cytology  Findings:  Successful Ultrasound-guided biopsy of R parotid FNA. A total of 3 samples were obtained. Hemostasis of the tract was achieved using Manual Pressure.  Plan: Bed rest for 0 hours.  See detailed procedure note with images in PACS. The patient tolerated the procedure well without incident or complication and was returned to Recovery in stable condition.    Roanna Banning, MD [facility] Radiology   Pager. [phone] Clinic. [phone]

## 2023-04-19 DIAGNOSIS — K648 Other hemorrhoids: Secondary | ICD-10-CM | POA: Diagnosis not present

## 2023-04-19 LAB — CYTOLOGY - NON PAP

## 2023-04-20 DIAGNOSIS — Z720 Tobacco use: Secondary | ICD-10-CM | POA: Diagnosis not present

## 2023-04-20 DIAGNOSIS — I6522 Occlusion and stenosis of left carotid artery: Secondary | ICD-10-CM | POA: Diagnosis not present

## 2023-04-20 DIAGNOSIS — D490 Neoplasm of unspecified behavior of digestive system: Secondary | ICD-10-CM | POA: Diagnosis not present

## 2023-04-26 ENCOUNTER — Other Ambulatory Visit: Payer: Self-pay | Admitting: Otolaryngology

## 2023-04-26 ENCOUNTER — Telehealth: Payer: Self-pay | Admitting: *Deleted

## 2023-04-26 NOTE — Telephone Encounter (Signed)
I left a message for the patient to call our office to schedule a tele visit for pre-op 

## 2023-04-26 NOTE — Telephone Encounter (Signed)
   Name: Joseph Sweeney  DOB: [birth year]  MRN: [id number]  Primary Cardiologist: Sheryle Donning, MD   Preoperative team, please contact this patient and set up a phone call appointment for further preoperative risk assessment. Please obtain consent and complete medication review. Thank you for your help.  I confirm that guidance regarding antiplatelet and oral anticoagulation therapy has been completed and, if necessary, noted below.  Per office protocol, if patient is without any new symptoms or concerns at the time of their virtual visit, he may hold aspirin for 5-7 days prior to procedure. Please resume aspirin as soon as possible postprocedure, at the discretion of the surgeon.    I also confirmed the patient resides in the state of [place] . As per [facility] Medical Board telemedicine laws, the patient must reside in the state in which the provider is licensed.   Ava Boatman, NP 04/26/2023, 2:05 PM [facility]

## 2023-04-26 NOTE — Telephone Encounter (Signed)
   Pre-operative Risk Assessment    Patient Name: Joseph Sweeney  DOB: 07-Jul-1950 MRN: [id number]   Date of last office visit: 09/01/22 DR. CHRISTOPHER  Date of next office visit: NONE   Request for Surgical Clearance    Procedure:   RIGHT TOTAL PAROTIDECTOMY WITH FACIAL NERVE DISSECTION  Date of Surgery:  Clearance 05/03/23                                Surgeon:  DR. Ralston Burkes Surgeon's Group or Practice Name:  [facility] Phone number:  [phone] Fax number:  [phone]   Type of Clearance Requested:   - Medical  - Pharmacy:  Hold Aspirin     Type of Anesthesia:  General    Additional requests/questions:    [name]   04/26/2023, 1:30 PM

## 2023-04-27 ENCOUNTER — Telehealth: Payer: Self-pay | Admitting: *Deleted

## 2023-04-27 ENCOUNTER — Ambulatory Visit (HOSPITAL_BASED_OUTPATIENT_CLINIC_OR_DEPARTMENT_OTHER)
Admission: RE | Admit: 2023-04-27 | Discharge: 2023-04-27 | Disposition: A | Discharge status: Home / Self Care | Source: Ambulatory Visit | Attending: Family Medicine | Admitting: Family Medicine

## 2023-04-27 DIAGNOSIS — I700 Atherosclerosis of aorta: Secondary | ICD-10-CM | POA: Diagnosis not present

## 2023-04-27 DIAGNOSIS — F17210 Nicotine dependence, cigarettes, uncomplicated: Secondary | ICD-10-CM | POA: Insufficient documentation

## 2023-04-27 DIAGNOSIS — Z122 Encounter for screening for malignant neoplasm of respiratory organs: Secondary | ICD-10-CM | POA: Insufficient documentation

## 2023-04-27 DIAGNOSIS — J439 Emphysema, unspecified: Secondary | ICD-10-CM | POA: Insufficient documentation

## 2023-04-27 NOTE — Telephone Encounter (Signed)
 Called to ask that we give patient a call back to set up the televisit. States that its been explain to the patient that appt is need. Please advise

## 2023-04-27 NOTE — Telephone Encounter (Addendum)
 Spoke with patient and patient feels that he doesn't need an clearance due to he started taking Asa on his own with no provider recommendations to do so. As explained to the patient, that the clearance was sent over to our office from Surgeon office with the assumption his primary cardiologist started. Spoke with [facility] NP about matter and suggested patient contact Surgeon office and explain this matter to see if he does need an clearance. Patient will contact office back if a clearance needed

## 2023-04-27 NOTE — Telephone Encounter (Signed)
 Spoke with patient and patient scheduled 04-30-23

## 2023-04-27 NOTE — Progress Notes (Signed)
 Surgical Instructions   Your procedure is scheduled on Monday, April 21st. Report to [facility] Main Entrance "A" at 1030 A.M., then check in with the Admitting office. Any questions or running late day of surgery: call [phone]  Questions prior to your surgery date: call [phone], Monday-Friday, 8am-4pm. If you experience any cold or flu symptoms such as cough, fever, chills, shortness of breath, etc. between now and your scheduled surgery, please notify us at the above number.     Remember:  Do not eat after midnight the night before your surgery   You may drink clear liquids until 9:30 the morning of your surgery.   Clear liquids allowed are: Water, Non-Citrus Juices (without pulp), Carbonated Beverages, Clear Tea (no milk, honey, etc.), Black Coffee Only (NO MILK, CREAM OR POWDERED CREAMER of any kind), and Gatorade.    Take these medicines the morning of surgery with A SIP OF WATER  rosuvastatin (CRESTOR)    Follow your surgeon's instructions on when to stop Asprin.  If no instructions were given by your surgeon then you will need to call the office to get those instructions.     One week prior to surgery, STOP taking any Aleve, Naproxen, Ibuprofen, Motrin, Advil, Goody's, BC's, all herbal medications, fish oil, and non-prescription vitamins.          WHAT DO I DO ABOUT MY DIABETES MEDICATION?   Do not take your METFORMIN (GLUCOPHAGE) the morning of surgery.  Last dose should be on Sunday, April 20th.  Hold your empagliflozin (JARDIANCE) for 72 hours prior to surgery.  Last dose should be on Thursday, April 17 th.       HOW TO [facility] YOUR DIABETES BEFORE AND AFTER SURGERY  Why is it important to control my blood sugar before and after surgery? Improving blood sugar levels before and after surgery helps healing and can limit problems. A way of improving blood sugar control is eating a healthy diet by:  Eating less sugar and carbohydrates  Increasing  activity/exercise  Talking with your doctor about reaching your blood sugar goals High blood sugars (greater than 180 mg/dL) can raise your risk of infections and slow your recovery, so you will need to focus on controlling your diabetes during the weeks before surgery. Make sure that the doctor who takes care of your diabetes knows about your planned surgery including the date and location.  How do I manage my blood sugar before surgery? Check your blood sugar at least 4 times a day, starting 2 days before surgery, to make sure that the level is not too high or low.  Check your blood sugar the morning of your surgery when you wake up and every 2 hours until you get to the Short Stay unit.  If your blood sugar is less than 70 mg/dL, you will need to treat for low blood sugar: Do not take insulin. Treat a low blood sugar (less than 70 mg/dL) with  cup of clear juice (cranberry or apple), 4 glucose tablets, OR glucose gel. Recheck blood sugar in 15 minutes after treatment (to make sure it is greater than 70 mg/dL). If your blood sugar is not greater than 70 mg/dL on recheck, call [phone] for further instructions. Report your blood sugar to the short stay nurse when you get to Short Stay.  If you are admitted to the hospital after surgery: Your blood sugar will be checked by the staff and you will probably be given insulin after surgery (instead of oral  diabetes medicines) to make sure you have good blood sugar levels. The goal for blood sugar control after surgery is 80-180 mg/dL.             Do NOT Smoke (Tobacco/Vaping) for 24 hours prior to your procedure.  If you use a CPAP at night, you may bring your mask/headgear for your overnight stay.   You will be asked to remove any contacts, glasses, piercing's, hearing aid's, dentures/partials prior to surgery. Please bring cases for these items if needed.    Patients discharged the day of surgery will not be allowed to drive home, and  someone needs to stay with them for 24 hours.  SURGICAL WAITING ROOM VISITATION Patients may have no more than 2 support people in the waiting area - these visitors may rotate.   Pre-op nurse will coordinate an appropriate time for 1 ADULT support person, who may not rotate, to accompany patient in pre-op.  Children under the age of 32 must have an adult with them who is not the patient and must remain in the main waiting area with an adult.  If the patient needs to stay at the hospital during part of their recovery, the visitor guidelines for inpatient rooms apply.  Please refer to the [facility] website for the visitor guidelines for any additional information.   If you received a COVID test during your pre-op visit  it is requested that you wear a mask when out in public, stay away from anyone that may not be feeling well and notify your surgeon if you develop symptoms. If you have been in contact with anyone that has tested positive in the last 10 days please notify you surgeon.      Pre-operative CHG Bathing Instructions   You can play a key role in reducing the risk of infection after surgery. Your skin needs to be as free of germs as possible. You can reduce the number of germs on your skin by washing with CHG (chlorhexidine gluconate) soap before surgery. CHG is an antiseptic soap that kills germs and continues to kill germs even after washing.   DO NOT use if you have an allergy to chlorhexidine/CHG or antibacterial soaps. If your skin becomes reddened or irritated, stop using the CHG and notify one of our RNs at [phone].              TAKE A SHOWER THE NIGHT BEFORE SURGERY AND THE DAY OF SURGERY    Please keep in mind the following:  DO NOT shave, including legs and underarms, 48 hours prior to surgery.   You may shave your face before/day of surgery.  Place clean sheets on your bed the night before surgery Use a clean washcloth (not used since being washed) for each  shower. DO NOT sleep with pet's night before surgery.  CHG Shower Instructions:  Wash your face and private area with normal soap. If you choose to wash your hair, wash first with your normal shampoo.  After you use shampoo/soap, rinse your hair and body thoroughly to remove shampoo/soap residue.  Turn the water OFF and apply half the bottle of CHG soap to a CLEAN washcloth.  Apply CHG soap ONLY FROM YOUR NECK DOWN TO YOUR TOES (washing for 3-5 minutes)  DO NOT use CHG soap on face, private areas, open wounds, or sores.  Pay special attention to the area where your surgery is being performed.  If you are having back surgery, having someone wash your back  for you may be helpful. Wait 2 minutes after CHG soap is applied, then you may rinse off the CHG soap.  Pat dry with a clean towel  Put on clean pajamas    Additional instructions for the day of surgery: DO NOT APPLY any lotions, deodorants, cologne, or perfumes.   Do not wear jewelry or makeup Do not wear nail polish, gel polish, artificial nails, or any other type of covering on natural nails (fingers and toes) Do not bring valuables to the hospital. [facility] is not responsible for valuables/personal belongings. Put on clean/comfortable clothes.  Please brush your teeth.  Ask your nurse before applying any prescription medications to the skin.

## 2023-04-27 NOTE — Telephone Encounter (Signed)
  Patient Consent for Virtual Visit   [name] has provided verbal consent on 04/27/2023 for a virtual visit (video or telephone).   CONSENT FOR VIRTUAL VISIT FOR:  Joseph Sweeney  By participating in this virtual visit I agree to the following:  I hereby voluntarily request, consent and authorize [facility] and its employed or contracted physicians, physician assistants, nurse practitioners or other licensed health care professionals (the Practitioner), to provide me with telemedicine health care services (the "Services") as deemed necessary by the treating Practitioner. I acknowledge and consent to receive the Services by the Practitioner via telemedicine. I understand that the telemedicine visit will involve communicating with the Practitioner through live audiovisual communication technology and the disclosure of certain medical information by electronic transmission. I acknowledge that I have been given the opportunity to request an in-person assessment or other available alternative prior to the telemedicine visit and am voluntarily participating in the telemedicine visit.  I understand that I have the right to withhold or withdraw my consent to the use of telemedicine in the course of my care at any time, without affecting my right to future care or treatment, and that the Practitioner or I may terminate the telemedicine visit at any time. I understand that I have the right to inspect all information obtained and/or recorded in the course of the telemedicine visit and may receive copies of available information for a reasonable fee.  I understand that some of the potential risks of receiving the Services via telemedicine include:  Delay or interruption in medical evaluation due to technological equipment failure or disruption; Information transmitted may not be sufficient (e.g. poor resolution of images) to allow for appropriate medical decision making by the Practitioner; and/or   In rare instances, security protocols could fail, causing a breach of personal health information.  Furthermore, I acknowledge that it is my responsibility to provide information about my medical history, conditions and care that is complete and accurate to the best of my ability. I acknowledge that Practitioner's advice, recommendations, and/or decision may be based on factors not within their control, such as incomplete or inaccurate data provided by me or distortions of diagnostic images or specimens that may result from electronic transmissions. I understand that the practice of medicine is not an exact science and that Practitioner makes no warranties or guarantees regarding treatment outcomes. I acknowledge that a copy of this consent can be made available to me via my patient portal [facility] MyChart), or I can request a printed copy by calling the office of [facility].    I understand that my insurance will be billed for this visit.   I have read or had this consent read to me. I understand the contents of this consent, which adequately explains the benefits and risks of the Services being provided via telemedicine.  I have been provided ample opportunity to ask questions regarding this consent and the Services and have had my questions answered to my satisfaction. I give my informed consent for the services to be provided through the use of telemedicine in my medical care

## 2023-04-28 ENCOUNTER — Encounter (HOSPITAL_COMMUNITY)
Admission: RE | Admit: 2023-04-28 | Discharge: 2023-04-28 | Disposition: A | Discharge status: Home / Self Care | Source: Ambulatory Visit | Attending: Otolaryngology | Admitting: Otolaryngology

## 2023-04-28 ENCOUNTER — Other Ambulatory Visit: Payer: Self-pay

## 2023-04-28 ENCOUNTER — Encounter (HOSPITAL_COMMUNITY): Payer: Self-pay

## 2023-04-28 VITALS — BP 146/51 | HR 57 | Temp 97.9°F | Resp 17 | Ht 69.0 in | Wt 146.7 lb

## 2023-04-28 DIAGNOSIS — N183 Chronic kidney disease, stage 3 unspecified: Secondary | ICD-10-CM | POA: Insufficient documentation

## 2023-04-28 DIAGNOSIS — E1122 Type 2 diabetes mellitus with diabetic chronic kidney disease: Secondary | ICD-10-CM | POA: Diagnosis not present

## 2023-04-28 DIAGNOSIS — Z01812 Encounter for preprocedural laboratory examination: Secondary | ICD-10-CM | POA: Diagnosis not present

## 2023-04-28 DIAGNOSIS — Z01818 Encounter for other preprocedural examination: Secondary | ICD-10-CM

## 2023-04-28 HISTORY — DX: Essential (primary) hypertension: I10

## 2023-04-28 LAB — GLUCOSE, CAPILLARY: Glucose-Capillary: 159 mg/dL — ABNORMAL HIGH (ref 70–99)

## 2023-04-28 LAB — BASIC METABOLIC PANEL WITH GFR
Anion gap: 11 (ref 5–15)
BUN: 31 mg/dL — ABNORMAL HIGH (ref 8–23)
CO2: 23 mmol/L (ref 22–32)
Calcium: 9.9 mg/dL (ref 8.9–10.3)
Chloride: 107 mmol/L (ref 98–111)
Creatinine, Ser: 1.46 mg/dL — ABNORMAL HIGH (ref 0.61–1.24)
GFR, Estimated: 51 mL/min — ABNORMAL LOW (ref >60)
Glucose, Bld: 235 mg/dL — ABNORMAL HIGH (ref 70–99)
Potassium: 5 mmol/L (ref 3.5–5.1)
Sodium: 141 mmol/L (ref 135–145)

## 2023-04-28 LAB — CBC
HCT: 41.3 % (ref 39.0–52.0)
Hemoglobin: 13.4 g/dL (ref 13.0–17.0)
MCH: 33.5 pg (ref 26.0–34.0)
MCHC: 32.4 g/dL (ref 30.0–36.0)
MCV: 103.3 fL — ABNORMAL HIGH (ref 80.0–100.0)
Platelets: 403 10*3/uL — ABNORMAL HIGH (ref 150–400)
RBC: 4 MIL/uL — ABNORMAL LOW (ref 4.22–5.81)
RDW: 13.4 % (ref 11.5–15.5)
WBC: 9.5 10*3/uL (ref 4.0–10.5)
nRBC: 0 % (ref 0.0–0.2)

## 2023-04-28 LAB — HEMOGLOBIN A1C
Hgb A1c MFr Bld: 6.3 % — ABNORMAL HIGH (ref 4.8–5.6)
Mean Plasma Glucose: 134.11 mg/dL

## 2023-04-28 NOTE — Progress Notes (Signed)
 PCP - Dr. Dallas Due Cardiologist - Dr. Sheryle Donning - has a video call with on 04-30-23 @ 1:40 pm for cardiac clearance (LOV 09-01-22 with follow up in 1 yr)  PPM/ICD - Denies Device Orders - N/A Rep Notified - N/A  Chest x-ray - N/A EKG - 09-01-22 Stress Test - Pt states he had one in his 50's but does not remember where or who did it just that it was good. ECHO - Denies Cardiac Cath - Denies  Sleep Study - Denies CPAP - N/A  Fasting Blood Sugar - 90-100 Checks Blood Sugar:  Per pt he has the supplies but does not check his blood sugar  Last dose of GLP1 agonist-  Denies GLP1 instructions: N/A  Blood Thinner Instructions: Denies Aspirin Instructions: Pharmacy recommended a 5-7 day hold. Patient stated he takes a baby aspirin by his own choice and his last dose was taken on Sunday, April 13th.  ERAS Protcol - Clears until 0930 PRE-SURGERY Ensure or G2- none  COVID TEST- N/A   Anesthesia review: Yes, DM, CKD, HTN, CAD, cardiac clearance  Patient denies shortness of breath, fever, cough and chest pain at PAT appointment. Patient denies any respiratory issues at this time.    All instructions explained to the patient, with a verbal understanding of the material. Patient agrees to go over the instructions while at home for a better understanding. Patient also instructed to self quarantine after being tested for COVID-19. The opportunity to ask questions was provided.

## 2023-04-29 ENCOUNTER — Encounter (HOSPITAL_COMMUNITY): Payer: Self-pay | Admitting: Otolaryngology

## 2023-04-29 ENCOUNTER — Encounter (HOSPITAL_COMMUNITY): Payer: Self-pay | Admitting: Vascular Surgery

## 2023-04-29 NOTE — Progress Notes (Addendum)
 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   History includes former smoker, HTN, HLD, DM2, CKD (stage 3), carotid artery stenosis, ED.   He was evaluated by cardiologist Dr. Veryl Gottron on 09/01/22 for aortic and coronary atherosclerosis on imaging (CT 04/2022) and carotid artery stenosis (50-69% LICA 05/2022).  He self referred.  She recommended tensive fine statin therapy and changed him to rosuvastatin  20 mg daily.  Losartan  continued for hypertension.  Healthy lifestyle habits also discussed.  1 year follow-up planned.  In February 2025, Dr. Alecia Huntsman ordered a CTA of the neck to further evaluation carotid artery disease. Results from 02/26/23 showed percent stenosis in the proximal right ICA and 50 to 55% stenosis in the proximal left ICA.  There was vertebral artery stenosis.  An incidental right parotid gland lesion was also noted, prompting referral to ENT.  04/16/23 FNA right parotid mass showed atypical cells present, oncocytic lesion. Above procedure planned.   04/27/23 CT Chest LCS report is still in process.   A1c 6.3%. He is on Jardiance  and metformin . Have asked staff to let him know to hold Jardiance  for 72 hours prior to surgery.   Last ASA reported as 04/25/23.   He has preoperative cardiology evaluation scheduled for 04/30/23. Chart will be left for follow-up.   ADDENDUM 04/30/23 2:39 PM: Preoperative telephonic cardiology evaluation today with Charles Connor, NP: "Preoperative Cardiovascular Risk Assessment: - Patient's RCRI score is 0.9%   The patient affirms he has been doing well without any new cardiac  symptoms. They are able to achieve 7 METS without cardiac limitations. Therefore, based on ACC/AHA guidelines, the patient would be at acceptable risk for the planned procedure without further cardiovascular testing. The patient was advised that if he develops new symptoms prior to surgery to contact our office to arrange for a follow-up visit, and he verbalized understanding...  -Patient can hold aspirin 5 to 7 days prior to procedure and should restart postprocedure when surgically safe and hemostasis is achieved. - Patient may also hold Jardiance  3 days prior to procedure".  Chest CT report is still in process. Anesthesia team to evaluate on the day of surgery.   VS: BP (!) 146/51   Pulse (!) 57   Temp 36.6 C   Resp 17   Ht 5\' 9"  (1.753 m)   Wt 66.5 kg   SpO2 100%   BMI 21.66 kg/m    PROVIDERS: Masneri, Laurence Pons, DO (Inactive) [place] [facility]) Sheryle Donning, MD is cardiologist Lanita Pitman, MD is GI   LABS: Lab results from 04/28/23 reviewed and included:  (all labs ordered are listed, but only abnormal results are displayed)  Labs Reviewed  CBC - Abnormal; Notable for the following components:      Result Value   RBC 4.00 (*)    MCV 103.3 (*)    Platelets 403 (*)    All other components within normal limits  BASIC METABOLIC PANEL WITH GFR - Abnormal; Notable for the following components:   Glucose, Bld 235 (*)    BUN 31 (*)    Creatinine, Ser 1.46 (*)  GFR, Estimated 51 (*)    All other components within normal limits  HEMOGLOBIN A1C - Abnormal; Notable for the following components:   Hgb A1c MFr Bld 6.3 (*)    All other components within normal limits  GLUCOSE, CAPILLARY - Abnormal; Notable for the following components:   Glucose-Capillary 159 (*)    All other components within normal limits    IMAGES: CT Chest LCS 04/27/23: Report in process.   CT Abd/pelvis 03/09/23: IMPRESSION: *No acute abnormality of the abdomen or pelvis. *Bilateral  nephrolithiasis without evidence of hydronephrosis or ureteral stones. *Contracted gallbladder with possible small gallstones. No gallbladder inflammatory changes. *Atheromatous calcifications of the abdominal aorta without definitive evidence of aneurism maximum diameter 2.2 cm. *No follow-up imaging recommended for simple renal cysts.   CTA Neck 02/26/23: IMPRESSION: 1. Aortic atherosclerosis. 2. Atherosclerotic disease at the right carotid bifurcation and ICA bulb. 15% stenosis of the proximal right ICA. 3. Atherosclerotic disease at the left carotid bifurcation and ICA bulb. 50-55% stenosis of the proximal left ICA. 4. Atherosclerotic disease at the origins of both vertebral arteries. The dominant right vertebral artery shows a luminal diameter of 1.4 mm, consistent with a stenosis of approximately 60%. The non dominant left vertebral artery shows a luminal diameter of less than 1 mm at the origin, consistent with severe stenosis. Both vessels do show antegrade flow through the cervical region, through the foramen magnum to the basilar artery. 5. No intracranial large vessel occlusion or flow limiting proximal stenosis. 6. Well-circumscribed enhancing mass lesion of the deep lobe of the parotid gland on the right measuring 14 x 14 x 9 mm. Well-circumscribed nature favors a benign or indolent lesion such as Warthin's tumor or pleomorphic adenoma, but that cannot be stated with certainty. ENT referral recommended for further evaluation. - Aortic Atherosclerosis (ICD10-I70.0).   EKG: 09/01/22: SB at 58 bpm   CV: US  Carotid 05/27/22: IMPRESSION: 1. Moderate amount of left-sided atherosclerotic plaque results in elevated peak systolic velocities within the proximal left ICA compatible with the 50-69% luminal narrowing range. Further evaluation with CTA could be performed as indicated. 2. Moderate amount of right-sided atherosclerotic plaque, not resulting in a hemodynamically  significant stenosis. 3. Mild asymmetry between the bilateral upper extremity blood pressures with preservation of antegrade flow within the bilateral vertebral arteries.   Remote history of prior stress test > 20 years ago.   Past Medical History:  Diagnosis Date   Carotid artery stenosis    Chronic kidney disease    Stage 3   Diabetes mellitus without complication [facility])    ED (erectile dysfunction)    Hyperlipidemia    Hypertension    Parotid neoplasm    Tobacco abuse    Quit 08/2011    Past Surgical History:  Procedure Laterality Date   APPENDECTOMY  2006   TONSILLECTOMY      MEDICATIONS: No current facility-administered medications for this encounter.    aspirin EC 81 MG tablet   Choline Fenofibrate (FENOFIBRIC ACID) 135 MG CPDR   empagliflozin  (JARDIANCE ) 10 MG TABS tablet   losartan  (COZAAR ) 100 MG tablet   metFORMIN  (GLUCOPHAGE ) 1000 MG tablet   Omega-3 Fatty Acids (FISH OIL) 1200 MG CAPS   rosuvastatin  (CRESTOR ) 20 MG tablet   tadalafil (CIALIS) 20 MG tablet   triamcinolone cream (KENALOG) 0.1 %   Vitamin D, Ergocalciferol, (DRISDOL) 1.25 MG (50000 UNIT) CAPS capsule    Ella Gun, PA-C Surgical Short Stay/Anesthesiology [facility] Phone [phone] [facility] Phone [phone] 04/29/2023 12:59  PM

## 2023-04-29 NOTE — Progress Notes (Signed)
 Error/ Duplicate

## 2023-04-29 NOTE — Progress Notes (Signed)
 Called the patient and informed that today was the last day to take his Jardiance prior to his procedure.  Patient verbally stated he remembered and has already taken today but it is not in his pill bottle till after surgery.

## 2023-04-29 NOTE — Anesthesia Preprocedure Evaluation (Addendum)
 Anesthesia Evaluation  Patient identified by MRN, date of birth, ID band Patient awake    Reviewed: Allergy & Precautions, NPO status , Patient's Chart, lab work & pertinent test results  Airway Mallampati: I  TM Distance: >3 FB Neck ROM: Full    Dental  (+) Edentulous Upper, Edentulous Lower, Dental Advisory Given   Pulmonary former smoker   Pulmonary exam normal breath sounds clear to auscultation       Cardiovascular hypertension, Pt. on medications Normal cardiovascular exam Rhythm:Regular Rate:Normal     Neuro/Psych negative neurological ROS  negative psych ROS   GI/Hepatic negative GI ROS, Neg liver ROS,,,  Endo/Other  diabetes, Type 2, Oral Hypoglycemic Agents    Renal/GU Renal InsufficiencyRenal disease  negative genitourinary   Musculoskeletal negative musculoskeletal ROS (+)    Abdominal   Peds  Hematology negative hematology ROS (+)   Anesthesia Other Findings History includes former smoker, HTN, HLD, DM2, CKD (stage 3), carotid artery stenosis, ED  Reproductive/Obstetrics                             Anesthesia Physical Anesthesia Plan  ASA: 3  Anesthesia Plan: General   Post-op Pain Management: Tylenol  PO (pre-op)*   Induction: Intravenous  PONV Risk Score and Plan: 2 and Dexamethasone , Ondansetron  and Treatment may vary due to age or medical condition  Airway Management Planned: Oral ETT  Additional Equipment:   Intra-op Plan:   Post-operative Plan: Extubation in OR  Informed Consent: I have reviewed the patients History and Physical, chart, labs and discussed the procedure including the risks, benefits and alternatives for the proposed anesthesia with the patient or authorized representative who has indicated his/her understanding and acceptance.     Dental advisory given  Plan Discussed with: CRNA  Anesthesia Plan Comments: (Remi gtt   )        Anesthesia Quick Evaluation

## 2023-04-30 ENCOUNTER — Ambulatory Visit: Attending: Nurse Practitioner

## 2023-04-30 DIAGNOSIS — Z01810 Encounter for preprocedural cardiovascular examination: Secondary | ICD-10-CM

## 2023-04-30 NOTE — Progress Notes (Signed)
 Virtual Visit via Telephone Note   Because of Joseph Sweeney co-morbid illnesses, he is at least at moderate risk for complications without adequate follow up.  This format is felt to be most appropriate for this patient at this time.  Due to technical limitations with video connection (technology), today's appointment will be conducted as an audio only telehealth visit, and Joseph Sweeney verbally agreed to proceed in this manner.   All issues noted in this document were discussed and addressed.  No physical exam could be performed with this format.  Evaluation Performed:  Preoperative cardiovascular risk assessment _____________   Date:  04/30/2023   Patient ID:  Joseph Sweeney, DOB 1950-06-08, MRN [id number] Patient Location:  Home Provider location:   Office  Primary Care Provider:  Jinger Mount, DO (Inactive) Primary Cardiologist:  Sheryle Donning, MD  Chief Complaint / Patient Profile   73 y.o. y/o male with a h/o nonobstructive CAD, HLD, HTN, DM type II, CKD stage III, carotid artery stenosis (LICA 50 to 69%) who is pending right total parotidectomy with facial nerve dissection and presents today for telephonic preoperative cardiovascular risk assessment.  History of Present Illness    Joseph Sweeney is a 73 y.o. male who presents via audio/video conferencing for a telehealth visit today.  Pt was last seen in cardiology clinic on 09/01/2022 by Dr. Janeece Mechanic.  At that time Joseph Sweeney was doing well with no complaint of chest pain but endorsed some dizziness.  Blood pressure was initially elevated but improved on recheck and statin therapy was increased due to carotid stenosis.  He was also started on Jardiance  during visit.  The patient is now pending procedure as outlined above. Since his last visit, he has been doing well with no new cardiac complaints.  He reports that his blood sugars have been stabilizing since he began Jardiance .  He is also tolerating his  cholesterol medication without any side effects.  He is able to complete greater than 4 METS of activity without any difficulty.  He denies chest pain, shortness of breath, lower extremity edema, fatigue, palpitations, melena, hematuria, hemoptysis, diaphoresis, weakness, presyncope, syncope, orthopnea, and PND.   Past Medical History    Past Medical History:  Diagnosis Date   Carotid artery stenosis    Chronic kidney disease    Stage 3   Diabetes mellitus without complication [facility])    ED (erectile dysfunction)    Hyperlipidemia    Hypertension    Parotid neoplasm    Tobacco abuse    Quit 08/2011   Past Surgical History:  Procedure Laterality Date   APPENDECTOMY  2006   TONSILLECTOMY      Allergies  Allergies  Allergen Reactions   Lisinopril Swelling   Zyban [Bupropion] Swelling    Home Medications    Prior to Admission medications   Medication Sig Start Date End Date Taking? Authorizing Provider  aspirin EC 81 MG tablet Take 81 mg by mouth in the morning.    [provider]  Choline Fenofibrate (FENOFIBRIC ACID) 135 MG CPDR Take 135 mg by mouth daily before supper. 06/07/22   [provider]  empagliflozin  (JARDIANCE ) 10 MG TABS tablet Take 10 mg by mouth every evening.    [provider]  losartan  (COZAAR ) 100 MG tablet Take 100 mg by mouth daily before supper. 06/12/22   [provider]  metFORMIN  (GLUCOPHAGE ) 1000 MG tablet Take 1,000 mg by mouth daily with breakfast. 06/02/18  [provider]  Omega-3 Fatty Acids (FISH OIL) 1200 MG CAPS Take 1,200 mg by mouth in the morning and at bedtime.    [provider]  rosuvastatin  (CRESTOR ) 20 MG tablet Take 1 tablet (20 mg total) by mouth daily. Patient taking differently: Take 20 mg by mouth daily before supper. 09/01/22 04/26/26  Sheryle Donning, MD  tadalafil (CIALIS) 20 MG tablet Take 20 mg by mouth daily as needed for erectile dysfunction.    [provider]  triamcinolone cream (KENALOG) 0.1 % Apply 1 Application topically every other day. Applied to scalp after showering.    [provider]  Vitamin D, Ergocalciferol, (DRISDOL) 1.25 MG (50000 UNIT) CAPS capsule Take 50,000 Units by mouth every 14 (fourteen) days. Sundays 07/21/22   [provider]    Physical Exam    Vital Signs:  Joseph Sweeney does not have vital signs available for review today.  Given telephonic nature of communication, physical exam is limited. AAOx3. NAD. Normal affect.  Speech and respirations are unlabored.  Accessory Clinical Findings    None  Assessment & Plan    1.  Preoperative Cardiovascular Risk Assessment: - Patient's RCRI score is 0.9%  The patient affirms he has been doing well without any new cardiac symptoms. They are able to achieve 7 METS without cardiac limitations. Therefore, based on ACC/AHA guidelines, the patient would be at acceptable risk for the planned procedure without further cardiovascular testing. The patient was advised that if he develops new symptoms prior to surgery to contact our office to arrange for a follow-up visit, and he verbalized understanding.   The patient was advised that if he develops new symptoms prior to surgery to contact our office to arrange for a follow-up visit, and he verbalized understanding.  -Patient can hold aspirin 5 to 7 days prior to procedure and should restart postprocedure when surgically safe and hemostasis is achieved. - Patient may also hold Jardiance  3 days prior to procedure  A copy of this note will be routed to requesting surgeon.  Time:   Today, I have spent 6 minutes with the patient with telehealth technology discussing medical history, symptoms, and management plan.     Francene Ing, Retha Cast, NP  04/30/2023, 7:13 AM

## 2023-05-03 ENCOUNTER — Other Ambulatory Visit: Payer: Self-pay

## 2023-05-03 ENCOUNTER — Ambulatory Visit (HOSPITAL_BASED_OUTPATIENT_CLINIC_OR_DEPARTMENT_OTHER): Payer: Self-pay | Admitting: Vascular Surgery

## 2023-05-03 ENCOUNTER — Observation Stay (HOSPITAL_COMMUNITY)
Admission: RE | Admit: 2023-05-03 | Discharge: 2023-05-04 | Disposition: A | Discharge status: Home / Self Care | Attending: Otolaryngology | Admitting: Otolaryngology

## 2023-05-03 ENCOUNTER — Encounter (HOSPITAL_COMMUNITY): Payer: Self-pay | Admitting: Otolaryngology

## 2023-05-03 ENCOUNTER — Ambulatory Visit (HOSPITAL_COMMUNITY): Payer: Self-pay | Admitting: Vascular Surgery

## 2023-05-03 ENCOUNTER — Encounter (HOSPITAL_COMMUNITY)
Admission: RE | Disposition: A | Discharge status: Home / Self Care | Payer: Self-pay | Source: Home / Self Care | Attending: Otolaryngology

## 2023-05-03 DIAGNOSIS — I129 Hypertensive chronic kidney disease with stage 1 through stage 4 chronic kidney disease, or unspecified chronic kidney disease: Secondary | ICD-10-CM | POA: Insufficient documentation

## 2023-05-03 DIAGNOSIS — E1122 Type 2 diabetes mellitus with diabetic chronic kidney disease: Secondary | ICD-10-CM | POA: Insufficient documentation

## 2023-05-03 DIAGNOSIS — Z87891 Personal history of nicotine dependence: Secondary | ICD-10-CM | POA: Diagnosis not present

## 2023-05-03 DIAGNOSIS — C07 Malignant neoplasm of parotid gland: Secondary | ICD-10-CM | POA: Diagnosis not present

## 2023-05-03 DIAGNOSIS — N183 Chronic kidney disease, stage 3 unspecified: Secondary | ICD-10-CM | POA: Diagnosis not present

## 2023-05-03 DIAGNOSIS — I10 Essential (primary) hypertension: Secondary | ICD-10-CM

## 2023-05-03 DIAGNOSIS — D490 Neoplasm of unspecified behavior of digestive system: Secondary | ICD-10-CM | POA: Diagnosis not present

## 2023-05-03 DIAGNOSIS — K118 Other diseases of salivary glands: Secondary | ICD-10-CM | POA: Diagnosis not present

## 2023-05-03 DIAGNOSIS — D110 Benign neoplasm of parotid gland: Secondary | ICD-10-CM | POA: Diagnosis not present

## 2023-05-03 HISTORY — PX: PAROTIDECTOMY: SHX2163

## 2023-05-03 HISTORY — DX: Neoplasm of unspecified behavior of digestive system: D49.0

## 2023-05-03 HISTORY — DX: Occlusion and stenosis of unspecified carotid artery: I65.29

## 2023-05-03 LAB — GLUCOSE, CAPILLARY
Glucose-Capillary: 158 mg/dL — ABNORMAL HIGH (ref 70–99)
Glucose-Capillary: 125 mg/dL — ABNORMAL HIGH (ref 70–99)
Glucose-Capillary: 210 mg/dL — ABNORMAL HIGH (ref 70–99)
Glucose-Capillary: 247 mg/dL — ABNORMAL HIGH (ref 70–99)

## 2023-05-03 SURGERY — EXCISION, PAROTID GLAND
Anesthesia: General | Laterality: Right

## 2023-05-03 MED ORDER — HYDROCODONE-ACETAMINOPHEN 5-325 MG PO TABS: 1.0000 | ORAL_TABLET | Freq: Four times a day (QID) | ORAL | 0 refills | Status: AC | PRN

## 2023-05-03 MED ORDER — PHENYLEPHRINE HCL-NACL 20-0.9 MG/250ML-% IV SOLN
INTRAVENOUS | Status: DC | PRN
  Administered 2023-05-03: 40 ug/min via INTRAVENOUS
  Administered 2023-05-03: 25 ug/min via INTRAVENOUS

## 2023-05-03 MED ORDER — PHENYLEPHRINE 80 MCG/ML (10ML) SYRINGE FOR IV PUSH (FOR BLOOD PRESSURE SUPPORT)
PREFILLED_SYRINGE | INTRAVENOUS | Status: DC | PRN
  Administered 2023-05-03: 80 ug via INTRAVENOUS

## 2023-05-03 MED ORDER — DEXAMETHASONE SODIUM PHOSPHATE 10 MG/ML IJ SOLN
INTRAMUSCULAR | Status: DC | PRN
  Administered 2023-05-03: 6 mg via INTRAVENOUS
  Administered 2023-05-03: 4 mg via INTRAVENOUS

## 2023-05-03 MED ORDER — 0.9 % SODIUM CHLORIDE (POUR BTL) OPTIME
TOPICAL | Status: DC | PRN
  Administered 2023-05-03: 1000 mL

## 2023-05-03 MED ORDER — FENTANYL CITRATE (PF) 250 MCG/5ML IJ SOLN
INTRAMUSCULAR | Status: DC | PRN
  Administered 2023-05-03 (×4): 50 ug via INTRAVENOUS

## 2023-05-03 MED ORDER — EPINEPHRINE HCL (NASAL) 0.1 % NA SOLN
NASAL | Status: AC
  Filled 2023-05-03: qty 30

## 2023-05-03 MED ORDER — DEXAMETHASONE SODIUM PHOSPHATE 10 MG/ML IJ SOLN
INTRAMUSCULAR | Status: AC
  Filled 2023-05-03: qty 1

## 2023-05-03 MED ORDER — ONDANSETRON HCL 4 MG/2ML IJ SOLN
INTRAMUSCULAR | Status: AC
  Filled 2023-05-03: qty 2

## 2023-05-03 MED ORDER — SUCCINYLCHOLINE CHLORIDE 200 MG/10ML IV SOSY
PREFILLED_SYRINGE | INTRAVENOUS | Status: DC | PRN
  Administered 2023-05-03: 120 mg via INTRAVENOUS

## 2023-05-03 MED ORDER — SODIUM CHLORIDE (PF) 0.9 % IJ SOLN
INTRAMUSCULAR | Status: AC
  Filled 2023-05-03: qty 100

## 2023-05-03 MED ORDER — PROPOFOL 1000 MG/100ML IV EMUL
INTRAVENOUS | Status: AC
  Filled 2023-05-03: qty 100

## 2023-05-03 MED ORDER — BACITRACIN 500 UNIT/GM EX OINT: 1.0000 | TOPICAL_OINTMENT | Freq: Two times a day (BID) | CUTANEOUS | Status: AC

## 2023-05-03 MED ORDER — BACITRACIN ZINC 500 UNIT/GM EX OINT
1.0000 | TOPICAL_OINTMENT | Freq: Two times a day (BID) | CUTANEOUS | Status: DC
  Administered 2023-05-03: 1 via TOPICAL
  Filled 2023-05-03: qty 28.35

## 2023-05-03 MED ORDER — OXYCODONE HCL 5 MG PO TABS: 5.0000 mg | ORAL_TABLET | Freq: Once | ORAL | Status: DC | PRN

## 2023-05-03 MED ORDER — MIDAZOLAM HCL 2 MG/2ML IJ SOLN
INTRAMUSCULAR | Status: AC
  Filled 2023-05-03: qty 2

## 2023-05-03 MED ORDER — FENTANYL CITRATE (PF) 250 MCG/5ML IJ SOLN
INTRAMUSCULAR | Status: AC
  Filled 2023-05-03: qty 5

## 2023-05-03 MED ORDER — EPHEDRINE SULFATE-NACL 50-0.9 MG/10ML-% IV SOSY
PREFILLED_SYRINGE | INTRAVENOUS | Status: DC | PRN
  Administered 2023-05-03 (×2): 10 mg via INTRAVENOUS

## 2023-05-03 MED ORDER — SODIUM CHLORIDE (PF) 0.9 % IJ SOLN
INTRAMUSCULAR | Status: DC | PRN
  Administered 2023-05-03: 10 mL

## 2023-05-03 MED ORDER — LIDOCAINE-EPINEPHRINE 1 %-1:100000 IJ SOLN
INTRAMUSCULAR | Status: AC
  Filled 2023-05-03: qty 1

## 2023-05-03 MED ORDER — EMPAGLIFLOZIN 10 MG PO TABS
10.0000 mg | ORAL_TABLET | Freq: Every evening | ORAL | Status: DC
  Filled 2023-05-03: qty 1

## 2023-05-03 MED ORDER — ROCURONIUM BROMIDE 10 MG/ML (PF) SYRINGE
PREFILLED_SYRINGE | INTRAVENOUS | Status: DC | PRN
  Administered 2023-05-03: 20 mg via INTRAVENOUS

## 2023-05-03 MED ORDER — INSULIN ASPART 100 UNIT/ML IJ SOLN: 0.0000 [IU] | INTRAMUSCULAR | Status: DC | PRN

## 2023-05-03 MED ORDER — CEFAZOLIN SODIUM-DEXTROSE 2-4 GM/100ML-% IV SOLN
INTRAVENOUS | Status: AC
Start: 2023-05-03 — End: 2023-05-03
  Filled 2023-05-03: qty 100

## 2023-05-03 MED ORDER — ALBUMIN HUMAN 5 % IV SOLN: INTRAVENOUS | Status: DC | PRN

## 2023-05-03 MED ORDER — ROSUVASTATIN CALCIUM 20 MG PO TABS: 20.0000 mg | ORAL_TABLET | Freq: Every day | ORAL | Status: DC

## 2023-05-03 MED ORDER — IBUPROFEN 400 MG PO TABS: 400.0000 mg | ORAL_TABLET | Freq: Four times a day (QID) | ORAL | Status: DC | PRN

## 2023-05-03 MED ORDER — EPINEPHRINE PF 1 MG/ML IJ SOLN
INTRAMUSCULAR | Status: AC
  Filled 2023-05-03: qty 1

## 2023-05-03 MED ORDER — SODIUM CHLORIDE 0.9 % IV SOLN
0.1000 ug/kg/min | INTRAVENOUS | Status: AC
  Administered 2023-05-03: .1 ug/kg/min via INTRAVENOUS
  Filled 2023-05-03: qty 2000

## 2023-05-03 MED ORDER — BACITRACIN ZINC 500 UNIT/GM EX OINT
TOPICAL_OINTMENT | CUTANEOUS | Status: AC
  Filled 2023-05-03: qty 28.35

## 2023-05-03 MED ORDER — LIDOCAINE 2% (20 MG/ML) 5 ML SYRINGE
INTRAMUSCULAR | Status: DC | PRN
  Administered 2023-05-03: 60 mg via INTRAVENOUS

## 2023-05-03 MED ORDER — ONDANSETRON HCL 4 MG/2ML IJ SOLN
INTRAMUSCULAR | Status: DC | PRN
  Administered 2023-05-03: 4 mg via INTRAVENOUS

## 2023-05-03 MED ORDER — AMISULPRIDE (ANTIEMETIC) 5 MG/2ML IV SOLN: 10.0000 mg | Freq: Once | INTRAVENOUS | Status: DC | PRN

## 2023-05-03 MED ORDER — PROPOFOL 500 MG/50ML IV EMUL
INTRAVENOUS | Status: DC | PRN
  Administered 2023-05-03: 75 ug/kg/min via INTRAVENOUS

## 2023-05-03 MED ORDER — OXYCODONE HCL 5 MG/5ML PO SOLN: 5.0000 mg | Freq: Once | ORAL | Status: DC | PRN

## 2023-05-03 MED ORDER — LOSARTAN POTASSIUM 50 MG PO TABS: 100.0000 mg | ORAL_TABLET | Freq: Every day | ORAL | Status: DC

## 2023-05-03 MED ORDER — OXYCODONE HCL 5 MG PO TABS: 5.0000 mg | ORAL_TABLET | ORAL | Status: DC | PRN

## 2023-05-03 MED ORDER — PROPOFOL 10 MG/ML IV BOLUS
INTRAVENOUS | Status: DC | PRN
  Administered 2023-05-03 (×2): 40 mg via INTRAVENOUS
  Administered 2023-05-03: 120 mg via INTRAVENOUS

## 2023-05-03 MED ORDER — FENTANYL CITRATE (PF) 100 MCG/2ML IJ SOLN: 25.0000 ug | INTRAMUSCULAR | Status: DC | PRN

## 2023-05-03 MED ORDER — METFORMIN HCL 500 MG PO TABS: 1000.0000 mg | ORAL_TABLET | Freq: Every day | ORAL | Status: DC

## 2023-05-03 MED ORDER — PROPOFOL 10 MG/ML IV BOLUS
INTRAVENOUS | Status: AC
  Filled 2023-05-03: qty 20

## 2023-05-03 MED ORDER — LACTATED RINGERS IV SOLN: INTRAVENOUS | Status: DC

## 2023-05-03 MED ORDER — INSULIN ASPART 100 UNIT/ML IJ SOLN
0.0000 [IU] | Freq: Three times a day (TID) | INTRAMUSCULAR | Status: DC
  Administered 2023-05-03: 3 [IU] via SUBCUTANEOUS

## 2023-05-03 MED ORDER — CEFAZOLIN SODIUM-DEXTROSE 2-3 GM-%(50ML) IV SOLR
INTRAVENOUS | Status: DC | PRN
  Administered 2023-05-03: 2 g via INTRAVENOUS

## 2023-05-03 MED ORDER — ACETAMINOPHEN 500 MG PO TABS
1000.0000 mg | ORAL_TABLET | Freq: Once | ORAL | Status: AC
  Administered 2023-05-03: 1000 mg via ORAL
  Filled 2023-05-03: qty 2

## 2023-05-03 MED ORDER — CHLORHEXIDINE GLUCONATE 0.12 % MT SOLN
15.0000 mL | Freq: Once | OROMUCOSAL | Status: AC
  Administered 2023-05-03: 15 mL via OROMUCOSAL
  Filled 2023-05-03: qty 15

## 2023-05-03 MED ORDER — DEXMEDETOMIDINE HCL IN NACL 80 MCG/20ML IV SOLN
INTRAVENOUS | Status: DC | PRN
  Administered 2023-05-03: 8 ug via INTRAVENOUS
  Administered 2023-05-03: 4 ug via INTRAVENOUS

## 2023-05-03 MED ORDER — PHENYLEPHRINE 80 MCG/ML (10ML) SYRINGE FOR IV PUSH (FOR BLOOD PRESSURE SUPPORT): PREFILLED_SYRINGE | INTRAVENOUS | Status: DC | PRN

## 2023-05-03 MED ORDER — ACETAMINOPHEN 325 MG PO TABS
650.0000 mg | ORAL_TABLET | Freq: Four times a day (QID) | ORAL | Status: DC
  Administered 2023-05-03 – 2023-05-04 (×2): 650 mg via ORAL
  Filled 2023-05-03 (×2): qty 2

## 2023-05-03 MED ORDER — ORAL CARE MOUTH RINSE: 15.0000 mL | Freq: Once | OROMUCOSAL | Status: AC

## 2023-05-03 SURGICAL SUPPLY — 66 items
ATTRACTOMAT 16X20 MAGNETIC DRP (DRAPES) IMPLANT
BAG COUNTER SPONGE SURGICOUNT (BAG) ×2 IMPLANT
BLADE SURG 12 STRL SS (BLADE) IMPLANT
BLADE SURG 15 STRL LF DISP TIS (BLADE) ×2 IMPLANT
CANISTER SUCT 3000ML PPV (MISCELLANEOUS) ×2 IMPLANT
CLEANER TIP ELECTROSURG 2X2 (MISCELLANEOUS) ×2 IMPLANT
CLIP TI MEDIUM 24 (CLIP) ×2 IMPLANT
CLIP TI WIDE RED SMALL 24 (CLIP) ×2 IMPLANT
CORD BIPOLAR FORCEPS 12FT (ELECTRODE) ×2 IMPLANT
COVER SURGICAL LIGHT HANDLE (MISCELLANEOUS) ×2 IMPLANT
DRAIN CHANNEL 15F RND FF W/TCR (WOUND CARE) IMPLANT
DRAIN CHANNEL 19F RND (DRAIN) IMPLANT
DRAIN JP 10F RND RADIO (DRAIN) IMPLANT
DRAPE INCISE 13X13 STRL (DRAPES) IMPLANT
DRAPE INCISE 23X17 STRL (DRAPES) ×2 IMPLANT
DRAPE INCISE IOBAN 23X17 STRL (DRAPES) ×1 IMPLANT
DRAPE UTILITY XL STRL (DRAPES) IMPLANT
DRSG TEGADERM 2-3/8X2-3/4 SM (GAUZE/BANDAGES/DRESSINGS) ×4 IMPLANT
DRSG TEGADERM 4X4.75 (GAUZE/BANDAGES/DRESSINGS) IMPLANT
ELECT COATED BLADE 2.86 ST (ELECTRODE) ×2 IMPLANT
ELECTRODE 4 CHANNEL SET (MISCELLANEOUS) IMPLANT
ELECTRODE PAIRED SUBDERMAL (MISCELLANEOUS) ×2 IMPLANT
ELECTRODE REM PT RTRN 9FT ADLT (ELECTROSURGICAL) ×2 IMPLANT
EVACUATOR SILICONE 100CC (DRAIN) IMPLANT
FORCEPS BIPOLAR SPETZLER 8 1.0 (NEUROSURGERY SUPPLIES) IMPLANT
GAUZE 4X4 16PLY ~~LOC~~+RFID DBL (SPONGE) IMPLANT
GLOVE BIO SURGEON STRL SZ7.5 (GLOVE) ×2 IMPLANT
GLOVE BIOGEL PI IND STRL 8 (GLOVE) ×2 IMPLANT
GOWN STRL REUS W/ TWL LRG LVL3 (GOWN DISPOSABLE) ×2 IMPLANT
GOWN STRL REUS W/ TWL XL LVL3 (GOWN DISPOSABLE) ×2 IMPLANT
HEMOSTAT SURGICEL 2X14 (HEMOSTASIS) IMPLANT
KIT BASIN OR (CUSTOM PROCEDURE TRAY) ×2 IMPLANT
KIT TURNOVER KIT B (KITS) ×2 IMPLANT
LOCATOR NERVE 3 VOLT (DISPOSABLE) IMPLANT
LOOP VESSEL MINI RED (MISCELLANEOUS) IMPLANT
NDL FILTER BLUNT 18X1 1/2 (NEEDLE) ×2 IMPLANT
NDL PRECISIONGLIDE 27X1.5 (NEEDLE) ×2 IMPLANT
NEEDLE FILTER BLUNT 18X1 1/2 (NEEDLE) ×1 IMPLANT
NEEDLE PRECISIONGLIDE 27X1.5 (NEEDLE) ×1 IMPLANT
NS IRRIG 1000ML POUR BTL (IV SOLUTION) ×2 IMPLANT
PAD ARMBOARD POSITIONER FOAM (MISCELLANEOUS) ×4 IMPLANT
PATTIES SURGICAL .5 X3 (DISPOSABLE) IMPLANT
PENCIL SMOKE EVACUATOR (MISCELLANEOUS) ×2 IMPLANT
POSITIONER HEAD DONUT 9IN (MISCELLANEOUS) ×2 IMPLANT
PROBE NERVBE PRASS .33 (MISCELLANEOUS) ×2 IMPLANT
SET WALTER ACTIVATION W/DRAPE (SET/KITS/TRAYS/PACK) IMPLANT
SPONGE INTESTINAL PEANUT (DISPOSABLE) ×2 IMPLANT
SPONGE T-LAP 18X18 ~~LOC~~+RFID (SPONGE) ×2 IMPLANT
STAPLER VISISTAT 35W (STAPLE) ×2 IMPLANT
SUT ETHILON 3 0 PS 1 (SUTURE) IMPLANT
SUT ETHILON 5 0 PS 2 18 (SUTURE) ×2 IMPLANT
SUT ETHILON 6 0 9-3 1X18 BLK (SUTURE) ×2 IMPLANT
SUT SILK 2 0 SH CR/8 (SUTURE) ×2 IMPLANT
SUT SILK 3 0 REEL (SUTURE) ×2 IMPLANT
SUT SILK 3 0 SH 30 (SUTURE) IMPLANT
SUT SILK 3 0 SH CR/8 (SUTURE) ×2 IMPLANT
SUT SILK 4 0 REEL (SUTURE) IMPLANT
SUT VIC AB 3-0 SH 8-18 (SUTURE) ×2 IMPLANT
SUT VIC AB 4-0 PS2 18 (SUTURE) ×2 IMPLANT
SUT VIC AB 4-0 SH 18 (SUTURE) IMPLANT
SUT VICRYL 4-0 PS2 18IN ABS (SUTURE) ×2 IMPLANT
SYR TB 1ML LUER SLIP (SYRINGE) ×2 IMPLANT
TOWEL GREEN STERILE FF (TOWEL DISPOSABLE) ×2 IMPLANT
TRAY ENT MC OR (CUSTOM PROCEDURE TRAY) ×2 IMPLANT
TRAY FOLEY MTR SLVR 14FR STAT (SET/KITS/TRAYS/PACK) IMPLANT
WATER STERILE IRR 1000ML POUR (IV SOLUTION) ×2 IMPLANT

## 2023-05-03 NOTE — Transfer of Care (Signed)
 Immediate Anesthesia Transfer of Care Note  Patient: Myrna Ast  Procedure(s) Performed: RIGHT TOTAL PAROTIDECTOMY WITH FACIAL NERVE DISSECTION (Right)  Patient Location: PACU  Anesthesia Type:General  Level of Consciousness: drowsy  Airway & Oxygen Therapy: Patient Spontanous Breathing and Patient connected to face mask oxygen  Post-op Assessment: Report given to RN and Post -op Vital signs reviewed and stable  Post vital signs: Reviewed and stable  Last Vitals:  Vitals Value Taken Time  BP 130/55 05/03/23 1650  Temp 36.8 C 05/03/23 1650  Pulse 73 05/03/23 1658  Resp 14 05/03/23 1658  SpO2 97 % 05/03/23 1658  Vitals shown include unfiled device data.  Last Pain:  Vitals:   05/03/23 1650  TempSrc:   PainSc: 0-No pain         Complications: No notable events documented.

## 2023-05-03 NOTE — Discharge Instructions (Signed)
Joseph Sweeney G. Joseph Rasor MD  HOME CARE INSTRUCTIONS FOLLOWING PAROTID GLAND SURGERY:  DIET:  - Patients who have received general anesthesia may experience nausea and occasionally, vomiting. It is therefore preferable to eat a bland light meal or a liquid diet on the first day after the surgery. Regular diet may be resumed the next day. Also, pain pills may cause nausea if taken on an empty stomach. It is preferable to take those pills with a piece of toast or some food. It is important to maintain hydration by drinking lots of fluids after the procedure.   ACTIVITY AND WOUND CARE:  - Elevate the head as much as possible. Sit in a recliner or use two or three pillows when sleeping in bed. Head elevation reduces bruising and swelling. Occasionally, you may notice that the bruises or swelling have migrated to other places (usually lower regions). You may have a dressing or your wound may be exposed.   - If your wound is not covered with a dressing keep the exposed wound dry. Avoid showers for the first 48 hours. After this you may shower, but avoid shower water directly hitting the surgical wound. If you do get the wound wet, pat dry the area immediately after your shower. If you have been instructed to use a topical antibiotic and have not received a prescription for antibiotic ointment, use over-the-counter triple antibiotic such as Neosporin. Apply a scanty amount on the suture line. At times, you may not see the sutures because they have been placed inside the wound.  MEDICATIONS:  - It is important to take all medications as prescribed by your surgeon. An antibiotic may be prescribed following the surgery. The patient also receives a prescription for narcotic pain killers. These products cause somnolence, drowsiness and constipation. Patients who take painkillers should not operate machinery, drive or make important decisions. - Do not use aspirin for 2 weeks; it increases the possibility of  bleeding.  WHAT TO EXPECT:  - For the first one to two weeks after surgery, there will be a mild to moderate amount of pain in the neck, side of the face, jaw, ear, or throat. This can be controlled with prescription pain medicine received at hospital discharge or Tylenol (acetaminophen). Ice may be applied to the affected area to help reduce pain and swelling. - You may have numbness, tingling and pain around the surgical site. This may last a long time but will become less noticeable with time.  - There may be discomfort during eating, and swallowing hard, crunchy foods may cause increased discomfort in the involved region. In time, this discomfort will improve. - Swelling in the surgical site is normal and will peak in 2-3 days. Call our office if the incision swells excessively, becomes increasingly red and or starts to drain. - If you experience dry eyes on the operative side, it is ok to use natural lubricating tears to keep the eye well moistened.   RESTRICTIONS:  - Bed rest and very light activity is the rule for the first 24 hours postoperatively. You may increase your activity level as necessary, but use common sense. - Refrain from vigorous activity for the 10 days after surgery. Do not lift objects weighing over 8-10 pounds (roughly the weight of a gallon of milk) for a minimum of 2 weeks following surgery.  - After surgery, you may shower below the neck. Avoid showering above the neck until at least 48 hours after surgery. We strongly discourage soaking the incision in   the bathtub, swimming pool, or hot tub until you have discussed this with your physician. - Driving is prohibited while taking prescription pain medicine. Additionally patients should not drive until for a minimum of 2 weeks (or as instructed by your physician) following surgery of the head and neck region. Surgery in this area may cause decreased range of motion making driving unsafe. Do not drive if you are unable to  look behind your shoulder comfortably.  WHAT ARE SOME REASONS TO CONTACT YOUR DOCTOR AFTER SURGERY?  - After parotid surgery there is a small risk for temporary or even permanent facial paralysis on the operative side. If you notice any increase in facial asymmetry after discharge, particularly with closing your eyes fully, or with an abnormal droop of the side of your mouth, please contact our office. - Some swelling around the incision is normal. However, if is any sudden significant increase in neck swelling, apply an ice pack to the neck and call our office. If this occurs after regular business hours, please call  [phone] and ask for the on call ENT resident. - If you develop and shortness of breath or difficulty, breathing please present to the nearest emergency department emergently for evaluation. - Many people will experience low-grade fevers after surgery that may persist for one to two days. Low grade fevers (less than 101 F) usually will resolve with Tylenol and fluids. If you have a high fever (greater than 101 F) that lasts longer than 24 hours without any improvement, please notify our service. - At any time during the postop period, please call the office if you have any questions or concerns about excessive bleeding, breathing difficulty, pain, persistent fever, nausea, swelling or other concerns that seem out of the ordinary from what you have discussed with your surgeon or read in this handout.  [facility] [place] [address] , [place] [address]  Phone: [phone]    

## 2023-05-03 NOTE — Anesthesia Procedure Notes (Addendum)
 Procedure Name: Intubation Date/Time: 05/03/2023 1:05 PM  Performed by: Eugena Herter, RNPre-anesthesia Checklist: Patient identified, Patient being monitored, Timeout performed, Emergency Drugs available and Suction available Patient Re-evaluated:Patient Re-evaluated prior to induction Oxygen Delivery Method: Circle system utilized Preoxygenation: Pre-oxygenation with 100% oxygen Induction Type: IV induction Ventilation: Mask ventilation without difficulty Laryngoscope Size: Miller and 2 Grade View: Grade I Tube type: Oral Tube size: 7.0 mm Number of attempts: 1 Airway Equipment and Method: Stylet Placement Confirmation: ETT inserted through vocal cords under direct vision, positive ETCO2 and breath sounds checked- equal and bilateral Secured at: 21 cm Tube secured with: Tape Dental Injury: Teeth and Oropharynx as per pre-operative assessment

## 2023-05-03 NOTE — Op Note (Signed)
 OPERATIVE NOTE  Joseph Sweeney Date/Time of Admission: 05/03/2023 10:05 AM  CSN: [id number];WUJ:[id number] Attending Provider: Rush Coupe, MD Room/Bed: MCPO/NONE DOB: 1950/05/29 Age: 73 y.o.   Pre-Op Diagnosis: Parotid neoplasm  Post-Op Diagnosis: Parotid neoplasm  Procedure: Right parotidectomy with facial nerve dissection  Anesthesia: General  Surgeon(s): Marijane Shoulders, MD  Staff: Circulator: Arie Kurtz, RN Scrub Person: Scott Cutting, RN; Lisbeth Rides  Implants: * No implants in log *  Specimens: ID Type Source Tests Collected by Time Destination  1 : RIGHT PAROTIDECTOMY Tissue Soft Tissue, Other SURGICAL PATHOLOGY Rush Coupe, MD 05/03/2023 1558     Complications: none  EBL: 50 ML  IVF: Per anesthesia ML  Condition: stable  Operative Findings:  Well circumscribed 2cm cystic mass deep/inferior to lower division / marginal mandibular branch of facial nerve  Description of Operation:  Once operative consent was obtained and the site and surgery were confirmed with the patient and the operating room team, the patient was brought back to the operating room and general endotracheal anesthesia was obtained. He was rotated to 180 degrees. The patient was turned over to the ENT service. The NIM facial nerve monitor was attached to the  orbicularis oculi and orbicularis oris, it was noted to be in good working condition.    A modified Blair incision was planned on the right hand side and infiltrated with plain 1:100,000 epinephrine . The patient was prepped and draped in sterile fashion. Modified Blair incision was then made with a 15 blade. Superiorly, this incision was taken down in the subperichondrial plane, taken to the tragal cartilage until the tragal pointer was identified. Inferiorly, in continuing the dissection, the external jugular vein was identified as well as the greater auricular nerve. The greater auricular nerve branch to the auricle was  preserved while branches into the parotid gland were ligated with surgical clips.   A subplatysmal flap was elevated anteriorly and posteriorly in the inferior portion of the incision, and a mid SMAS flap was elevated with 15 blade and metzenbaum scissors anteriorly approximately 2 cm anterior to the location of the mass. This dissection was continued until it met with the superior dissection and that flap was elevated as well.  The anterior border of the sternocleidomastoid muscle was identified and followed until the posterior border of digastric muscle was identified. The posterior digastric muscle was followed to its attachment to the mastoid bone. With superior and inferior dissection plane clearly delineated, which estimated the depth and location of the facial nerve, the facial nerve was identified at the exit from the stylomastoid foramen and traced to the upper and lower division. The mass was excised from the deep parotid lobe with care taken to dissect free the lower division / marginal mandibular branch of the facial nerve with careful dissection from superior to inferior (the tumor was released off the inferior division / marginal mandibular branch). The mass was freed from the facial nerve and excised with a margin of normal parotid tissue. The mass was well encapsulated and easily dissected free from the surrounding parotid tissue.   The facial nerve was grossly anatomically intact and all branches stimulated at .8mA with proximal stimulation.    Copious irrigation was placed into the wound and hemostasis was obtained. Facial nerve branches were examined and tested with the nerve monitor and intact. A size 15 french suction drain was placed into the incision posteriorly in the right neck and secured with 3-0 silk. The wound was closed with deep  interrupted 3-0 Vicryl and 4-0 vicryl platysmal and deep dermal sutures and a combination of running and interrupted 6-0 and nylon suture for the skin.  Bacitracin  was applied to the wound. The drain was placed on bulb suction. The patient was then turned over to the anesthesiologist. He was extubated in the operating room and sent to the PACU in stable condition. The patient will be admitted overnight for drain care.   Marijane Shoulders, MD [facility]  05/03/2023

## 2023-05-03 NOTE — H&P (Signed)
 Joseph Sweeney is an 73 y.o. male.    Chief Complaint:  Right parotid neoplasm  HPI: Patient presents today for planned elective procedure.  He/she denies any interval change in history since office visit on 04/20/23.   Past Medical History:  Diagnosis Date   Carotid artery stenosis    Chronic kidney disease    Stage 3   Diabetes mellitus without complication (HCC)    ED (erectile dysfunction)    Hyperlipidemia    Hypertension    Parotid neoplasm    Tobacco abuse    Quit 08/2011    Past Surgical History:  Procedure Laterality Date   APPENDECTOMY  2006   TONSILLECTOMY      Family History  Problem Relation Age of Onset   Cancer Mother        breast   Cancer Maternal Grandmother        colon    Social History:  reports that he quit smoking about 58 years ago. His smoking use included cigarettes. He has never used smokeless tobacco. He reports that he does not currently use alcohol. He reports that he does not use drugs.  Allergies:  Allergies  Allergen Reactions   Lisinopril Swelling   Zyban [Bupropion] Swelling    No medications prior to admission.    No results found for this or any previous visit (from the past 48 hours). No results found.  ROS: negative other than stated in HPI  There were no vitals taken for this visit.  PHYSICAL EXAM: General: Resting comfortably in NAD  Lungs: Non-labored respiratinos  Studies Reviewed: CT Angio Neck 02/26/23  Narrative & Impression  CLINICAL DATA:  Left ICA stenosis   EXAM: CT ANGIOGRAPHY NECK   TECHNIQUE: Multidetector CT imaging of the neck was performed using the standard protocol during bolus administration of intravenous contrast. Multiplanar CT image reconstructions and MIPs were obtained to evaluate the vascular anatomy. Carotid stenosis measurements (when applicable) are obtained utilizing NASCET criteria, using the distal internal carotid diameter as the denominator.   RADIATION DOSE REDUCTION:  This exam was performed according to the departmental dose-optimization program which includes automated exposure control, adjustment of the mA and/or kV according to patient size and/or use of iterative reconstruction technique.   CONTRAST:  60mL OMNIPAQUE  IOHEXOL  350 MG/ML SOLN   COMPARISON:  Ultrasound 05/27/2022   FINDINGS: Aortic arch: Aortic atherosclerotic calcification. Branching pattern is normal without origin stenosis.   Right carotid system: Common carotid artery widely patent to the bifurcation. Calcified plaque at the carotid bifurcation and ICA bulb. Minimal diameter in the ICA bulb is 3.7 mm. Compared to a more distal cervical ICA diameter of 4.3 mm, this indicates only a 15% stenosis.   Left carotid system: Common carotid artery widely patent to the bifurcation. Calcified plaque at the carotid bifurcation and ICA bulb. Minimal diameter in the ICA bulb is 2 mm. Compared to a more distal cervical ICA diameter of 4.2 mm, this indicates a 50-55% stenosis. Mild atherosclerotic plaque of the ICA just beneath the skull base with minimal diameter of 3 mm.   Vertebral arteries: There is atherosclerotic disease of the proximal left subclavian artery with stenosis of 30%. Both vertebral artery origins show calcified plaque. The dominant right vertebral artery shows a luminal diameter of 1.4 mm. The non dominant left vertebral artery shows a luminal diameter of less than 1 mm. Both vessels do show antegrade flow through the cervical region, through the foramen magnum to the basilar artery. No basilar  stenosis.   No intracranial large vessel occlusion or flow limiting proximal stenosis. No aneurysm or vascular malformation.   Skeleton: Ordinary cervical spondylosis.   Other neck: Well-circumscribed enhancing mass lesion of the deep lobe of the parotid gland on the right measuring 14 x 14 x 9 mm. Well-circumscribed nature favors a benign or indolent lesion such  as Warthin's tumor or pleomorphic adenoma, but that cannot be stated with certainty. ENT referral recommended for further evaluation.   Upper chest: Mild apical scarring.   IMPRESSION: 1. Aortic atherosclerosis. 2. Atherosclerotic disease at the right carotid bifurcation and ICA bulb. 15% stenosis of the proximal right ICA. 3. Atherosclerotic disease at the left carotid bifurcation and ICA bulb. 50-55% stenosis of the proximal left ICA. 4. Atherosclerotic disease at the origins of both vertebral arteries. The dominant right vertebral artery shows a luminal diameter of 1.4 mm, consistent with a stenosis of approximately 60%. The non dominant left vertebral artery shows a luminal diameter of less than 1 mm at the origin, consistent with severe stenosis. Both vessels do show antegrade flow through the cervical region, through the foramen magnum to the basilar artery. 5. No intracranial large vessel occlusion or flow limiting proximal stenosis. 6. Well-circumscribed enhancing mass lesion of the deep lobe of the parotid gland on the right measuring 14 x 14 x 9 mm. Well-circumscribed nature favors a benign or indolent lesion such as Warthin's tumor or pleomorphic adenoma, but that cannot be stated with certainty. ENT referral recommended for further evaluation.   Aortic Atherosclerosis (ICD10-I70.0).     Electronically Signed   By: Bettylou Brunner M.D.   On: 03/09/2023 15:49   US  FNA 04/16/23  Clinical History: Parotid Mass  Specimen Submitted:  A. PAROTID, RIGHT, FINE NEEDLE ASPIRATION    FINAL MICROSCOPIC DIAGNOSIS:  - Atypical cells present  - Oncocytic lesion   SPECIMEN ADEQUACY:  Satisfactory for evaluation   Assessment/Plan Oncocytic neoplasm right parotid gland  Proceed with right parotidectomy to rule out malignancy.  Informed consent obtained. R/B/A discussed including risks of pain, bleeding, infection, scarring, numbness, Frey's syndrome, first bite syndrome,  recurrent tumor, facial nerve weakness/paralysis, ear numbness, hematoma/seroma/abscess, need for further surgery, risks of anesthesia. Despite these risks the patient requested to proceed with surgery.    Electronically signed by:  Rush Coupe, MD  Staff Physician Facial Plastic & Reconstructive Surgery Otolaryngology - Head and Neck Surgery [facility]  05/03/2023, 8:21 AM

## 2023-05-04 ENCOUNTER — Encounter (HOSPITAL_COMMUNITY): Payer: Self-pay | Admitting: Otolaryngology

## 2023-05-04 LAB — GLUCOSE, CAPILLARY: Glucose-Capillary: 241 mg/dL — ABNORMAL HIGH (ref 70–99)

## 2023-05-04 NOTE — Progress Notes (Signed)
 DISCHARGE NOTE HOME Joseph Sweeney to be discharged Home per MD order. Discussed prescriptions and follow up appointments with the patient. Prescriptions given to patient; medication list explained in detail. Patient verbalized understanding.  Skin clean, dry and intact without evidence of skin break down, no evidence of skin tears noted. IV catheter discontinued intact. Site without signs and symptoms of complications. Dressing and pressure applied. Pt denies pain at the site currently. No complaints noted.  See LDA discharging with jp drain and incision right neck  An After Visit Summary (AVS) was printed and given to the patient. Patient escorted via wheelchair, and discharged home via private auto.  Tonda Francisco, RN

## 2023-05-04 NOTE — Plan of Care (Signed)

## 2023-05-04 NOTE — Discharge Summary (Signed)
 Physician Discharge Summary  Patient ID: DEX BLAKELY MRN: [id number] DOB/AGE: [birth year] 73 y.o.  Admit date: 05/03/2023 Discharge date: 05/04/2023  Admission Diagnoses:  Principal Problem:   Parotid tumor   Discharge Diagnoses:  Same  Surgeries: Procedure(s): RIGHT PAROTIDECTOMY WITH FACIAL NERVE DISSECTION on 05/03/2023   Discharged Condition: Improved  Hospital Course: Joseph Sweeney is an 73 y.o. male who was admitted 05/03/2023 with a chief complaint of No chief complaint on file. , and found to have a diagnosis of Parotid tumor.  They were brought to the operating room on 05/03/2023 and underwent the above named procedures.    Physical Exam:  General: Awake and alert, no acute distress Neck: soft / supple. Incision c/d/I. JP holding bulb suction with s/s output Neuro: Facial nerve normal   Recent vital signs:  Vitals:   05/04/23 0011 05/04/23 0500  BP: (!) 129/49 (!) 140/55  Pulse: 61 76  Resp: 18 18  Temp: 98.4 F (36.9 C) 97.9 F (36.6 C)  SpO2: 100% 100%    Recent laboratory studies:  Results for orders placed or performed during the hospital encounter of 05/03/23  Glucose, capillary   Collection Time: 05/03/23 10:25 AM  Result Value Ref Range   Glucose-Capillary 158 (H) 70 - 99 mg/dL   Comment 1 Notify RN    Comment 2 Document in Chart   Glucose, capillary   Collection Time: 05/03/23 12:30 PM  Result Value Ref Range   Glucose-Capillary 125 (H) 70 - 99 mg/dL   Comment 1 Notify RN    Comment 2 Document in Chart   Glucose, capillary   Collection Time: 05/03/23  4:50 PM  Result Value Ref Range   Glucose-Capillary 210 (H) 70 - 99 mg/dL   Comment 1 Notify RN    Comment 2 Document in Chart   Glucose, capillary   Collection Time: 05/03/23  8:54 PM  Result Value Ref Range   Glucose-Capillary 247 (H) 70 - 99 mg/dL    Discharge Medications:   Allergies as of 05/04/2023       Reactions   Lisinopril Swelling   Zyban [bupropion] Swelling         Medication List     TAKE these medications    aspirin EC 81 MG tablet Take 81 mg by mouth in the morning.   bacitracin  500 UNIT/GM ointment Apply 1 Application topically 2 (two) times daily for 7 days.   Fenofibric Acid 135 MG Cpdr Take 135 mg by mouth daily before supper.   Fish Oil 1200 MG Caps Take 1,200 mg by mouth in the morning and at bedtime.   HYDROcodone -acetaminophen  5-325 MG tablet Commonly known as: NORCO/VICODIN Take 1 tablet by mouth every 6 (six) hours as needed for up to 5 days for moderate pain (pain score 4-6) or severe pain (pain score 7-10).   Jardiance  10 MG Tabs tablet Generic drug: empagliflozin  Take 10 mg by mouth every evening.   losartan  100 MG tablet Commonly known as: COZAAR  Take 100 mg by mouth daily before supper.   metFORMIN  1000 MG tablet Commonly known as: GLUCOPHAGE  Take 1,000 mg by mouth daily with breakfast.   rosuvastatin  20 MG tablet Commonly known as: CRESTOR  Take 1 tablet (20 mg total) by mouth daily. What changed: when to take this   tadalafil 20 MG tablet Commonly known as: CIALIS Take 20 mg by mouth daily as needed for erectile dysfunction.   triamcinolone cream 0.1 % Commonly known as: KENALOG Apply 1 Application topically every  other day. Applied to scalp after showering.   Vitamin D (Ergocalciferol) 1.25 MG (50000 UNIT) Caps capsule Commonly known as: DRISDOL Take 50,000 Units by mouth every 14 (fourteen) days. Sundays        Diagnostic Studies: US  FNA SALIVARY GLAND/PAROTID GLAND Result Date: 04/19/2023 INDICATION: parotid neoplasm.  Incidental RIGHT parotid mass on CTA neck EXAM: ULTRASOUND GUIDED FINE NEEDLE ASPIRATION OF RIGHT PAROTID MASS COMPARISON:  CTA NECK, 02/26/2023 MEDICATIONS: None COMPLICATIONS: None immediate. TECHNIQUE: Informed written consent was obtained from the patient after a discussion of the risks, benefits and alternatives to treatment. Questions regarding the procedure were encouraged and  answered. A timeout was performed prior to the initiation of the procedure. Pre-procedural ultrasound scanning demonstrated a ~1.5 cm deep parotid mass The procedure was planned. The neck was prepped in the usual sterile fashion, and a sterile drape was applied covering the operative field. A timeout was performed prior to the initiation of the procedure. Local anesthesia was provided with 1% lidocaine . Under direct ultrasound guidance, 4 FNA biopsies were performed of the RIGHT parotid mass with a 27 gauge needle. The samples were prepared and submitted to pathology. Limited post procedural scanning was negative for hematoma or additional complication. Dressings were placed. The patient tolerated the above procedures procedure well without immediate postprocedural complication. IMPRESSION: Successful ultrasound guided FNA biopsy of the RIGHT parotid mass Art Largo, MD [facility] Electronically Signed   By: Art Largo M.D.   On: 04/19/2023 08:07    Disposition: Discharge disposition: 01-Home or Self Care       Discharge Instructions     Discharge patient   Complete by: As directed    Discharge disposition: 01-Home or Self Care   Discharge patient date: 05/04/2023          Signed: Rush Coupe 05/04/2023, 8:05 AM

## 2023-05-04 NOTE — Progress Notes (Signed)
 OTOLARYNGOLOGY - HEAD AND NECK SURGERY FACIAL PLASTIC & RECONSTRUCTIVE SURGERY PROGRESS NOTE  ID: 73 y/o M with R parotid tumor POD#1 s/p parotitdectomy  Subjective: Doing well NAEON Eager to go home   Objective: Vital signs in last 24 hours: Temp:  [97.7 F (36.5 C)-98.4 F (36.9 C)] 97.9 F (36.6 C) (04/22 0500) Pulse Rate:  [53-77] 76 (04/22 0500) Resp:  [13-28] 18 (04/22 0500) BP: (126-143)/(49-63) 140/55 (04/22 0500) SpO2:  [97 %-100 %] 100 % (04/22 0500) Weight:  [65.8 kg] 65.8 kg (04/21 1022)  Physical exam:  General: Awake and alert, no acute distress Neck: soft / supple. Incision c/d/I. JP holding bulb suction with s/s output Neuro: Facial nerve normal   @LABLAST2 (wbc:2,hgb:2,hct:2,plt:2) No results for input(s): "NA", "K", "CL", "CO2", "GLUCOSE", "BUN", "CREATININE", "CALCIUM " in the last 72 hours.  JP DRAIN 60 mL  Medications: I have reviewed the patient's current medications.  Assessment/Plan: POD#1 s/p R Parotidectomy. Doing very well  Home with drain F/u Thursday for Drain removal   LOS: 0 days   Rush Coupe 05/04/2023, 8:05 AM  Electronically signed by:  Rush Coupe, MD  Staff Physician Facial Plastic & Reconstructive Surgery Otolaryngology - Head and Neck Surgery [facility] [place]

## 2023-05-05 LAB — SURGICAL PATHOLOGY

## 2023-05-05 NOTE — Anesthesia Postprocedure Evaluation (Signed)
 Anesthesia Post Note  Patient: JAMERION CABELLO  Procedure(s) Performed: RIGHT TOTAL PAROTIDECTOMY WITH FACIAL NERVE DISSECTION (Right)     Patient location during evaluation: PACU Anesthesia Type: General Level of consciousness: awake and alert Pain management: pain level controlled Vital Signs Assessment: post-procedure vital signs reviewed and stable Respiratory status: spontaneous breathing, nonlabored ventilation, respiratory function stable and patient connected to nasal cannula oxygen Cardiovascular status: blood pressure returned to baseline and stable Postop Assessment: no apparent nausea or vomiting Anesthetic complications: no  No notable events documented.  Last Vitals:  Vitals:   05/04/23 0500 05/04/23 0810  BP: (!) 140/55 (!) 136/51  Pulse: 76 (!) 58  Resp: 18 18  Temp: 36.6 C 36.5 C  SpO2: 100% 100%    Last Pain:  Vitals:   05/04/23 0810  TempSrc: Oral  PainSc: 0-No pain   Pain Goal: Patients Stated Pain Goal: 0 (05/04/23 0810)                 Merit Gadsby L Harriet Bollen

## 2023-05-14 DIAGNOSIS — E782 Mixed hyperlipidemia: Secondary | ICD-10-CM | POA: Diagnosis not present

## 2023-05-14 DIAGNOSIS — Z87891 Personal history of nicotine dependence: Secondary | ICD-10-CM | POA: Diagnosis not present

## 2023-05-14 DIAGNOSIS — E1165 Type 2 diabetes mellitus with hyperglycemia: Secondary | ICD-10-CM | POA: Diagnosis not present

## 2023-05-14 DIAGNOSIS — R634 Abnormal weight loss: Secondary | ICD-10-CM | POA: Diagnosis not present

## 2023-05-14 DIAGNOSIS — Z6821 Body mass index (BMI) 21.0-21.9, adult: Secondary | ICD-10-CM | POA: Diagnosis not present

## 2023-05-14 DIAGNOSIS — I10 Essential (primary) hypertension: Secondary | ICD-10-CM | POA: Diagnosis not present

## 2023-06-04 DIAGNOSIS — L570 Actinic keratosis: Secondary | ICD-10-CM | POA: Diagnosis not present

## 2023-06-04 DIAGNOSIS — L2989 Other pruritus: Secondary | ICD-10-CM | POA: Diagnosis not present

## 2023-06-04 DIAGNOSIS — L814 Other melanin hyperpigmentation: Secondary | ICD-10-CM | POA: Diagnosis not present

## 2023-06-04 DIAGNOSIS — L708 Other acne: Secondary | ICD-10-CM | POA: Diagnosis not present

## 2023-06-04 DIAGNOSIS — L218 Other seborrheic dermatitis: Secondary | ICD-10-CM | POA: Diagnosis not present

## 2023-08-09 DIAGNOSIS — D119 Benign neoplasm of major salivary gland, unspecified: Secondary | ICD-10-CM | POA: Diagnosis not present

## 2023-08-09 DIAGNOSIS — Z9889 Other specified postprocedural states: Secondary | ICD-10-CM | POA: Diagnosis not present

## 2023-09-14 DIAGNOSIS — R42 Dizziness and giddiness: Secondary | ICD-10-CM | POA: Diagnosis not present

## 2023-09-14 DIAGNOSIS — H6122 Impacted cerumen, left ear: Secondary | ICD-10-CM | POA: Diagnosis not present

## 2023-09-14 DIAGNOSIS — Z6822 Body mass index (BMI) 22.0-22.9, adult: Secondary | ICD-10-CM | POA: Diagnosis not present

## 2023-09-24 ENCOUNTER — Other Ambulatory Visit (HOSPITAL_BASED_OUTPATIENT_CLINIC_OR_DEPARTMENT_OTHER): Payer: Self-pay | Admitting: Physician Assistant

## 2023-09-24 DIAGNOSIS — J439 Emphysema, unspecified: Secondary | ICD-10-CM | POA: Diagnosis not present

## 2023-09-24 DIAGNOSIS — E559 Vitamin D deficiency, unspecified: Secondary | ICD-10-CM | POA: Diagnosis not present

## 2023-09-24 DIAGNOSIS — I6521 Occlusion and stenosis of right carotid artery: Secondary | ICD-10-CM | POA: Diagnosis not present

## 2023-09-24 DIAGNOSIS — E782 Mixed hyperlipidemia: Secondary | ICD-10-CM | POA: Diagnosis not present

## 2023-09-24 DIAGNOSIS — Z0000 Encounter for general adult medical examination without abnormal findings: Secondary | ICD-10-CM | POA: Diagnosis not present

## 2023-09-24 DIAGNOSIS — Z1331 Encounter for screening for depression: Secondary | ICD-10-CM | POA: Diagnosis not present

## 2023-09-24 DIAGNOSIS — N183 Chronic kidney disease, stage 3 unspecified: Secondary | ICD-10-CM | POA: Diagnosis not present

## 2023-09-24 DIAGNOSIS — Z125 Encounter for screening for malignant neoplasm of prostate: Secondary | ICD-10-CM | POA: Diagnosis not present

## 2023-09-24 DIAGNOSIS — I10 Essential (primary) hypertension: Secondary | ICD-10-CM | POA: Diagnosis not present

## 2023-09-24 DIAGNOSIS — Z6822 Body mass index (BMI) 22.0-22.9, adult: Secondary | ICD-10-CM | POA: Diagnosis not present

## 2023-09-24 DIAGNOSIS — E119 Type 2 diabetes mellitus without complications: Secondary | ICD-10-CM | POA: Diagnosis not present

## 2023-10-13 ENCOUNTER — Encounter (HOSPITAL_BASED_OUTPATIENT_CLINIC_OR_DEPARTMENT_OTHER): Payer: Self-pay

## 2023-10-13 ENCOUNTER — Ambulatory Visit (HOSPITAL_BASED_OUTPATIENT_CLINIC_OR_DEPARTMENT_OTHER)
Admission: RE | Admit: 2023-10-13 | Discharge: 2023-10-13 | Disposition: A | Discharge status: Home / Self Care | Source: Ambulatory Visit | Attending: Physician Assistant | Admitting: Physician Assistant

## 2023-10-13 DIAGNOSIS — I6521 Occlusion and stenosis of right carotid artery: Secondary | ICD-10-CM

## 2023-11-02 ENCOUNTER — Ambulatory Visit (HOSPITAL_BASED_OUTPATIENT_CLINIC_OR_DEPARTMENT_OTHER)
Admission: RE | Admit: 2023-11-02 | Discharge: 2023-11-02 | Disposition: A | Discharge status: Home / Self Care | Source: Ambulatory Visit | Attending: Physician Assistant | Admitting: Physician Assistant

## 2023-11-02 DIAGNOSIS — I6521 Occlusion and stenosis of right carotid artery: Secondary | ICD-10-CM | POA: Insufficient documentation

## 2023-11-02 DIAGNOSIS — I6523 Occlusion and stenosis of bilateral carotid arteries: Secondary | ICD-10-CM | POA: Diagnosis not present

## 2023-11-02 DIAGNOSIS — I779 Disorder of arteries and arterioles, unspecified: Secondary | ICD-10-CM | POA: Diagnosis not present

## 2023-11-30 DIAGNOSIS — J069 Acute upper respiratory infection, unspecified: Secondary | ICD-10-CM | POA: Diagnosis not present

## 2023-12-02 NOTE — Progress Notes (Signed)
 Patient ID: Joseph Sweeney, male   DOB: [birth year], 73 y.o.   MRN: [id number]  Reason for Consult: New Patient (Initial Visit)   Referred by [facility]*  Subjective:     HPI Joseph Sweeney is a 73 y.o. male presenting for evaluation of carotid artery stenosis.  He originally had a carotid duplex done in 2024 after calcifications were noted on a CT scan.  He has a known left ICA stenosis of 50 to 69% which was also demonstrated on the most recent duplex in October 2025. He is asymptomatic, denies any stroke or strokelike symptoms in the past.  Specifically denies any one-sided weakness, numbness, amaurosis or speech issues.  He is a former smoker.  He is compliant with aspirin and Crestor .  Past Medical History:  Diagnosis Date   Carotid artery stenosis    Chronic kidney disease    Stage 3   Diabetes mellitus without complication (HCC)    ED (erectile dysfunction)    Hyperlipidemia    Hypertension    Parotid neoplasm    Tobacco abuse    Quit 08/2011   Family History  Problem Relation Age of Onset   Cancer Mother        breast   Cancer Maternal Grandmother        colon   Past Surgical History:  Procedure Laterality Date   APPENDECTOMY  2006   PAROTIDECTOMY Right 05/03/2023   Procedure: RIGHT TOTAL PAROTIDECTOMY WITH FACIAL NERVE DISSECTION;  Surgeon: Luciano Standing, MD;  Location: [facility] OR;  Service: ENT;  Laterality: Right;  RIGHT TOTAL PAROTIDECTOMY WITH FACIAL NERVE DISSECTION   TONSILLECTOMY      Short Social History:  Social History   Tobacco Use   Smoking status: Former    Current packs/day: 0.00    Types: Cigarettes    Quit date: 01/12/1965    Years since quitting: 58.9   Smokeless tobacco: Never  Substance Use Topics   Alcohol use: Not Currently    Allergies  Allergen Reactions   Lisinopril Swelling   Zyban [Bupropion] Swelling    Current Outpatient Medications  Medication Sig Dispense Refill   aspirin EC 81 MG tablet Take 81 mg by mouth in  the morning.     Choline Fenofibrate (FENOFIBRIC ACID) 135 MG CPDR Take 135 mg by mouth daily before supper.     empagliflozin  (JARDIANCE ) 10 MG TABS tablet Take 10 mg by mouth every evening.     losartan  (COZAAR ) 100 MG tablet Take 100 mg by mouth daily before supper.     metFORMIN  (GLUCOPHAGE ) 1000 MG tablet Take 1,000 mg by mouth daily with breakfast.     Omega-3 Fatty Acids (FISH OIL) 1200 MG CAPS Take 1,200 mg by mouth in the morning and at bedtime.     rosuvastatin  (CRESTOR ) 20 MG tablet Take 1 tablet (20 mg total) by mouth daily. (Patient taking differently: Take 20 mg by mouth daily before supper.) 90 tablet 3   tadalafil (CIALIS) 20 MG tablet Take 20 mg by mouth daily as needed for erectile dysfunction.     triamcinolone cream (KENALOG) 0.1 % Apply 1 Application topically every other day. Applied to scalp after showering.     Vitamin D, Ergocalciferol, (DRISDOL) 1.25 MG (50000 UNIT) CAPS capsule Take 50,000 Units by mouth every 14 (fourteen) days. Sundays     No current facility-administered medications for this visit.    REVIEW OF SYSTEMS  All other systems were reviewed and are negative  Objective:  Objective   Vitals:   12/03/23 0839 12/03/23 0842  BP: 111/60 113/63  Pulse: 77   Resp: 18   Temp: 98.3 F (36.8 C)   TempSrc: Temporal   SpO2: 98%   Weight: 152 lb 8 oz (69.2 kg)   Height: 5' 9.5 (1.765 m)    Body mass index is 22.2 kg/m.  Physical Exam General: no acute distress Cardiac: hemodynamically stable Abdomen: non-tender, no pulsatile mass Extremities: no edema, cyanosis or wounds  Data: Carotid duplex Right, no flow-limiting stenosis.  Left 50 to 69%.  CT abdomen pelvis from February 2025 reviewed No evidence of aneurysm.  Some mild calcific disease in the infrarenal aorta and bilateral common iliac arteries.      Assessment/Plan:   Joseph Sweeney is a 73 y.o. male with asymptomatic carotid stenosis.  Left 50 to 69%.  Right without  flow-limiting stenosis.  We discussed the risk factors and natural history of arterial disease.  Explained the threshold of treatment in asymptomatic patients being 80%.  We discussed best medical management with aspirin, statin and abstinence from tobacco products which she is already compliant with.  Will plan for annual surveillance with a repeat duplex in 12 months. Regarding other vascular pathology.  His CT from February 2025 did not demonstrate any evidence of aneurysm  Plan for follow-up in 12 months with repeat carotid duplex  Recommendations to optimize cardiovascular risk: Abstinence from all tobacco products. Blood glucose control with goal A1c < 7%. Blood pressure control with goal blood pressure < 140/90 mmHg. Lipid reduction therapy with goal LDL-C <55 mg/dL  Aspirin 81mg  PO QD.  Atorvastatin 40-80mg  PO QD (or other high intensity statin therapy).   Norman GORMAN Serve MD [facility]

## 2023-12-03 ENCOUNTER — Ambulatory Visit: Attending: Vascular Surgery | Admitting: Vascular Surgery

## 2023-12-03 ENCOUNTER — Encounter: Payer: Self-pay | Admitting: Vascular Surgery

## 2023-12-03 VITALS — BP 113/63 | HR 77 | Temp 98.3°F | Resp 18 | Ht 69.5 in | Wt 152.5 lb

## 2023-12-03 DIAGNOSIS — I6523 Occlusion and stenosis of bilateral carotid arteries: Secondary | ICD-10-CM
